# Patient Record
Sex: Male | Born: 1980 | Race: Black or African American | Hispanic: No | Marital: Single | Smoking: Never smoker
Health system: Southern US, Community
[De-identification: ages and names within clinical notes are randomized; demographics above are authoritative.]

## PROBLEM LIST (undated history)

## (undated) DIAGNOSIS — F840 Autistic disorder: Secondary | ICD-10-CM

## (undated) DIAGNOSIS — F29 Unspecified psychosis not due to a substance or known physiological condition: Secondary | ICD-10-CM

## (undated) DIAGNOSIS — F988 Other specified behavioral and emotional disorders with onset usually occurring in childhood and adolescence: Secondary | ICD-10-CM

## (undated) DIAGNOSIS — F319 Bipolar disorder, unspecified: Secondary | ICD-10-CM

## (undated) HISTORY — PX: LEG SURGERY: SHX1003

---

## 2015-05-14 ENCOUNTER — Encounter (HOSPITAL_COMMUNITY): Payer: Self-pay | Admitting: Emergency Medicine

## 2015-05-14 ENCOUNTER — Emergency Department (HOSPITAL_COMMUNITY)
Admission: EM | Admit: 2015-05-14 | Discharge: 2015-05-15 | Disposition: A | Discharge status: Home / Self Care | Payer: Medicaid - Out of State | Attending: Emergency Medicine | Admitting: Emergency Medicine

## 2015-05-14 DIAGNOSIS — W01198A Fall on same level from slipping, tripping and stumbling with subsequent striking against other object, initial encounter: Secondary | ICD-10-CM | POA: Insufficient documentation

## 2015-05-14 DIAGNOSIS — S93402A Sprain of unspecified ligament of left ankle, initial encounter: Secondary | ICD-10-CM | POA: Diagnosis not present

## 2015-05-14 DIAGNOSIS — W19XXXA Unspecified fall, initial encounter: Secondary | ICD-10-CM

## 2015-05-14 DIAGNOSIS — F319 Bipolar disorder, unspecified: Secondary | ICD-10-CM | POA: Insufficient documentation

## 2015-05-14 DIAGNOSIS — S0083XA Contusion of other part of head, initial encounter: Secondary | ICD-10-CM

## 2015-05-14 DIAGNOSIS — Y999 Unspecified external cause status: Secondary | ICD-10-CM | POA: Diagnosis not present

## 2015-05-14 DIAGNOSIS — S01511A Laceration without foreign body of lip, initial encounter: Secondary | ICD-10-CM | POA: Diagnosis not present

## 2015-05-14 DIAGNOSIS — S0990XA Unspecified injury of head, initial encounter: Secondary | ICD-10-CM | POA: Diagnosis not present

## 2015-05-14 DIAGNOSIS — S199XXA Unspecified injury of neck, initial encounter: Secondary | ICD-10-CM | POA: Insufficient documentation

## 2015-05-14 DIAGNOSIS — Y9259 Other trade areas as the place of occurrence of the external cause: Secondary | ICD-10-CM | POA: Diagnosis not present

## 2015-05-14 DIAGNOSIS — S0993XA Unspecified injury of face, initial encounter: Secondary | ICD-10-CM | POA: Diagnosis present

## 2015-05-14 DIAGNOSIS — Z23 Encounter for immunization: Secondary | ICD-10-CM | POA: Diagnosis not present

## 2015-05-14 DIAGNOSIS — Y939 Activity, unspecified: Secondary | ICD-10-CM | POA: Insufficient documentation

## 2015-05-14 DIAGNOSIS — F840 Autistic disorder: Secondary | ICD-10-CM | POA: Diagnosis not present

## 2015-05-14 HISTORY — DX: Other specified behavioral and emotional disorders with onset usually occurring in childhood and adolescence: F98.8

## 2015-05-14 HISTORY — DX: Autistic disorder: F84.0

## 2015-05-14 HISTORY — DX: Bipolar disorder, unspecified: F31.9

## 2015-05-14 HISTORY — DX: Unspecified psychosis not due to a substance or known physiological condition: F29

## 2015-05-14 MED ORDER — TETANUS-DIPHTH-ACELL PERTUSSIS 5-2.5-18.5 LF-MCG/0.5 IM SUSP: 0.5000 mL | Freq: Once | INTRAMUSCULAR | Status: DC

## 2015-05-14 MED ORDER — HYDROCODONE-ACETAMINOPHEN 5-325 MG PO TABS
1.0000 | ORAL_TABLET | Freq: Once | ORAL | Status: AC
  Administered 2015-05-14: 1 via ORAL
  Filled 2015-05-14: qty 1

## 2015-05-14 MED ORDER — TETANUS-DIPHTH-ACELL PERTUSSIS 5-2.5-18.5 LF-MCG/0.5 IM SUSP
0.5000 mL | Freq: Once | INTRAMUSCULAR | Status: AC
  Administered 2015-05-14: 0.5 mL via INTRAMUSCULAR
  Filled 2015-05-14: qty 0.5

## 2015-05-14 NOTE — ED Notes (Signed)
Pt. lost his balance and fell this evening hit his left side of face against the cabinet , no LOC , ambulatory , presents with left facial  swelling and small laceration at left upper lip .

## 2015-05-14 NOTE — ED Provider Notes (Signed)
CSN: [id number]     Arrival date & time 05/14/15  2045 History   First MD Initiated Contact with Patient 05/14/15 2113     Chief Complaint  Patient presents with  . Fall  . Facial Injury     (Consider location/radiation/quality/duration/timing/severity/associated sxs/prior Treatment) Patient is a 34 y.o. male presenting with fall. The history is provided by the patient and a caregiver.  Fall This is a new (Patient states that he tripped today in the hotel room air staying in and fell face first in the corner of a dresser) problem. The current episode started 3 to 5 hours ago. The problem occurs constantly. The problem has not changed since onset.Associated symptoms include headaches. Associated symptoms comments: Facial pain and swelling.  No LOC. Since that time patient has had a headache, neck pain and facial pain and swelling. He denies any dental pain or loose teeth.. Nothing aggravates the symptoms. Nothing relieves the symptoms. He has tried a cold compress for the symptoms. The treatment provided no relief.    Past Medical History  Diagnosis Date  . ADD (attention deficit disorder)   . Bipolar 1 disorder   . Psychosis   . Autism    Past Surgical History  Procedure Laterality Date  . Leg surgery     No family history on file. History  Substance Use Topics  . Smoking status: Never Smoker   . Smokeless tobacco: Not on file  . Alcohol Use: No    Review of Systems  Neurological: Positive for headaches.  All other systems reviewed and are negative.     Allergies  Apple and Latex  Home Medications   Prior to Admission medications   Medication Sig Start Date End Date Taking? Authorizing Provider  amLODipine (NORVASC) 10 MG tablet Take 10 mg by mouth daily. 05/07/15  Yes Historical Provider, MD  QUEtiapine (SEROQUEL) 100 MG tablet Take 100 mg by mouth daily. 05/06/15  Yes Historical Provider, MD  QUEtiapine (SEROQUEL) 300 MG tablet Take 600 mg by mouth at bedtime.  05/07/15  Yes Historical Provider, MD   BP 137/73 mmHg  Pulse 77  Temp(Src) 98.4 F (36.9 C) (Oral)  Resp 12  SpO2 99% Physical Exam  Constitutional: He is oriented to person, place, and time. He appears well-developed and well-nourished. No distress.  HENT:  Head: Normocephalic and atraumatic.    Mouth/Throat: Oropharynx is clear and moist.  Eyes: Conjunctivae and EOM are normal. Pupils are equal, round, and reactive to light.  Neck: Normal range of motion. Neck supple. Spinous process tenderness present.  Cardiovascular: Normal rate, regular rhythm and intact distal pulses.   No murmur heard. Pulmonary/Chest: Effort normal and breath sounds normal. No respiratory distress. He has no wheezes. He has no rales.  Abdominal: Soft. He exhibits no distension. There is no tenderness. There is no rebound and no guarding.  Musculoskeletal: Normal range of motion. He exhibits no edema or tenderness.  Neurological: He is alert and oriented to person, place, and time.  Skin: Skin is warm and dry. No rash noted. No erythema.  Psychiatric: He has a normal mood and affect. His behavior is normal.  Nursing note and vitals reviewed.   ED Course  Procedures (including critical care time) Labs Review Labs Reviewed - No data to display  Imaging Review No results found.   EKG Interpretation None      MDM   Final diagnoses:  Fall  Fall    Patient with a mechanical fall into the corner  of a dresser today. He is complaining of left-sided facial pain with swelling and ecchymosis. Also complaining of a headache and neck pain. Patient has a history of autism and bipolar disease but currently is stable. He has a small superficial laceration to the lip that does not require repair at this time. Tetanus shot is unknown so will update today. CT of the head, neck and face pending  Pt checked out to Dr. Wilkie AyeHorton at 2400   Gwyneth SproutWhitney Caera Enwright, MD 05/14/15 2351

## 2015-05-15 ENCOUNTER — Encounter (HOSPITAL_COMMUNITY): Payer: Self-pay | Admitting: Radiology

## 2015-05-15 ENCOUNTER — Emergency Department (HOSPITAL_COMMUNITY): Payer: Medicaid - Out of State

## 2015-05-15 MED ORDER — HYDROCODONE-ACETAMINOPHEN 5-325 MG PO TABS
1.0000 | ORAL_TABLET | Freq: Once | ORAL | Status: AC
  Administered 2015-05-15: 1 via ORAL
  Filled 2015-05-15: qty 1

## 2015-05-15 MED ORDER — HYDROCODONE-ACETAMINOPHEN 5-325 MG PO TABS: 1.0000 | ORAL_TABLET | Freq: Four times a day (QID) | ORAL | Status: DC | PRN

## 2015-05-15 NOTE — ED Notes (Signed)
Patient transported to X-ray 

## 2015-05-15 NOTE — ED Notes (Signed)
Patient transported to CT 

## 2015-05-15 NOTE — ED Notes (Signed)
Called to Ct to get updated time frame on scans; updated family pt is next on the list

## 2015-05-15 NOTE — ED Notes (Signed)
Horton MD at bedside; Pt's left leg assessed for injury; Plan of care was discussed with parents; New orders to be placed

## 2015-05-15 NOTE — ED Provider Notes (Addendum)
Patient signed out by Dr. Anitra Lauth pending imaging. I was called into the patient's room with concerns over swelling of the left leg and ankle. Caregiver states that it is swollen and the patient states that it is painful. X-rays were ordered. They all expressed concerns regarding their wait time and duration in the ER. I apologized for this.  X-rays obtained. No fractures. CT reassuring. Ace bandage and ice were ordered. Patient was ambulated. Will discharge her pain medication.  No results found for this or any previous visit. Dg Tibia/fibula Left  05/15/2015   CLINICAL DATA:  Status post fall, with left lower leg pain. Initial encounter.  EXAM: LEFT TIBIA AND FIBULA - 2 VIEW  COMPARISON:  None.  FINDINGS: There is no evidence of fracture or dislocation. The tibia and fibula appear intact. The ankle mortise is grossly unremarkable. The knee joint is incompletely assessed, but unremarkable in appearance.  Diffuse soft tissue swelling is suggested about the medial aspect of the left lower leg.  IMPRESSION: No evidence of fracture or dislocation.   Electronically Signed   By: Roanna Raider M.D.   On: 05/15/2015 02:02   Dg Ankle Complete Left  05/15/2015   CLINICAL DATA:  Fall with diffuse left leg pain.  Initial encounter.  EXAM: LEFT ANKLE COMPLETE - 3+ VIEW  COMPARISON:  None.  FINDINGS: There is no evidence of fracture, dislocation, or joint effusion.  Varicose veins in the medial lower leg.  IMPRESSION: No osseous abnormality.   Electronically Signed   By: Marnee Spring M.D.   On: 05/15/2015 02:02   Ct Head Wo Contrast  05/15/2015   CLINICAL DATA:  Fall with left-sided face of injury causing swelling and laceration. Initial encounter.  EXAM: CT HEAD WITHOUT CONTRAST  CT MAXILLOFACIAL WITHOUT CONTRAST  CT CERVICAL SPINE WITHOUT CONTRAST  TECHNIQUE: Multidetector CT imaging of the head, cervical spine, and maxillofacial structures were performed using the standard protocol without intravenous contrast.  Multiplanar CT image reconstructions of the cervical spine and maxillofacial structures were also generated.  COMPARISON:  None.  FINDINGS: CT HEAD FINDINGS  Skull and Sinuses:Facial findings discussed below. No calvarial fracture.  Brain: No infarction, hemorrhage, hydrocephalus, or mass lesion/mass effect.  CT MAXILLOFACIAL FINDINGS  There is contusion of the left cheek without underlying fracture.  No evidence of globe injury or postseptal hematoma. Calcification present along both optic nerve sheath complexes, without associated thickening/mass.  Curvilinear mineralization in the right maxillary sinus has neighboring mucosal thickening and may reflect calcified debris from chronic sinusitis.  CT CERVICAL SPINE FINDINGS  Negative for acute fracture or subluxation. No prevertebral edema. No gross cervical canal hematoma. No significant osseous canal or foraminal stenosis.  IMPRESSION: No intracranial injury or facial/cervical spine fracture.   Electronically Signed   By: Marnee Spring M.D.   On: 05/15/2015 01:34   Ct Cervical Spine Wo Contrast  05/15/2015   CLINICAL DATA:  Fall with left-sided face of injury causing swelling and laceration. Initial encounter.  EXAM: CT HEAD WITHOUT CONTRAST  CT MAXILLOFACIAL WITHOUT CONTRAST  CT CERVICAL SPINE WITHOUT CONTRAST  TECHNIQUE: Multidetector CT imaging of the head, cervical spine, and maxillofacial structures were performed using the standard protocol without intravenous contrast. Multiplanar CT image reconstructions of the cervical spine and maxillofacial structures were also generated.  COMPARISON:  None.  FINDINGS: CT HEAD FINDINGS  Skull and Sinuses:Facial findings discussed below. No calvarial fracture.  Brain: No infarction, hemorrhage, hydrocephalus, or mass lesion/mass effect.  CT MAXILLOFACIAL FINDINGS  There is  contusion of the left cheek without underlying fracture.  No evidence of globe injury or postseptal hematoma. Calcification present along both  optic nerve sheath complexes, without associated thickening/mass.  Curvilinear mineralization in the right maxillary sinus has neighboring mucosal thickening and may reflect calcified debris from chronic sinusitis.  CT CERVICAL SPINE FINDINGS  Negative for acute fracture or subluxation. No prevertebral edema. No gross cervical canal hematoma. No significant osseous canal or foraminal stenosis.  IMPRESSION: No intracranial injury or facial/cervical spine fracture.   Electronically Signed   By: Marnee SpringJonathon  Watts M.D.   On: 05/15/2015 01:34   Dg Knee Complete 4 Views Left  05/15/2015   CLINICAL DATA:  Fall with diffuse left leg pain.  Initial encounter.  EXAM: LEFT KNEE - COMPLETE 4+ VIEW  COMPARISON:  None.  FINDINGS: There is no evidence of fracture, dislocation, or joint effusion.  Sizable varicose veins medial and posterior to the knee.  IMPRESSION: 1. No osseous abnormality. 2. Large varicose veins.   Electronically Signed   By: Marnee SpringJonathon  Watts M.D.   On: 05/15/2015 02:01   Ct Maxillofacial Wo Cm  05/15/2015   CLINICAL DATA:  Fall with left-sided face of injury causing swelling and laceration. Initial encounter.  EXAM: CT HEAD WITHOUT CONTRAST  CT MAXILLOFACIAL WITHOUT CONTRAST  CT CERVICAL SPINE WITHOUT CONTRAST  TECHNIQUE: Multidetector CT imaging of the head, cervical spine, and maxillofacial structures were performed using the standard protocol without intravenous contrast. Multiplanar CT image reconstructions of the cervical spine and maxillofacial structures were also generated.  COMPARISON:  None.  FINDINGS: CT HEAD FINDINGS  Skull and Sinuses:Facial findings discussed below. No calvarial fracture.  Brain: No infarction, hemorrhage, hydrocephalus, or mass lesion/mass effect.  CT MAXILLOFACIAL FINDINGS  There is contusion of the left cheek without underlying fracture.  No evidence of globe injury or postseptal hematoma. Calcification present along both optic nerve sheath complexes, without associated  thickening/mass.  Curvilinear mineralization in the right maxillary sinus has neighboring mucosal thickening and may reflect calcified debris from chronic sinusitis.  CT CERVICAL SPINE FINDINGS  Negative for acute fracture or subluxation. No prevertebral edema. No gross cervical canal hematoma. No significant osseous canal or foraminal stenosis.  IMPRESSION: No intracranial injury or facial/cervical spine fracture.   Electronically Signed   By: Marnee SpringJonathon  Watts M.D.   On: 05/15/2015 01:34      Shon Batonourtney F Kahlyn Shippey, MD 05/15/15 [phone]  Patient's caregiver and husband were requesting FMLA paperwork to be filled out. Discussed with them that this would not be done in the ER. Given that the patient had no significant injuries and I am not his primary care provider, I do not feel it is appropriate for me to fill out additional paperwork. [facility] were [facility]  He was given a limited work excuse for 2 days.  Shon Batonourtney F Eldrige Pitkin, MD 05/15/15 [phone]

## 2015-05-15 NOTE — Discharge Instructions (Signed)
Ankle Sprain °An ankle sprain is an injury to the strong, fibrous tissues (ligaments) that hold the bones of your ankle joint together.  °CAUSES °An ankle sprain is usually caused by a fall or by twisting your ankle. Ankle sprains most commonly occur when you step on the outer edge of your foot, and your ankle turns inward. People who participate in sports are more prone to these types of injuries.  °SYMPTOMS  °· Pain in your ankle. The pain may be present at rest or only when you are trying to stand or walk. °· Swelling. °· Bruising. Bruising may develop immediately or within 1 to 2 days after your injury. °· Difficulty standing or walking, particularly when turning corners or changing directions. °DIAGNOSIS  °Your caregiver will ask you details about your injury and perform a physical exam of your ankle to determine if you have an ankle sprain. During the physical exam, your caregiver will press on and apply pressure to specific areas of your foot and ankle. Your caregiver will try to move your ankle in certain ways. An X-ray exam may be done to be sure a bone was not broken or a ligament did not separate from one of the bones in your ankle (avulsion fracture).  °TREATMENT  °Certain types of braces can help stabilize your ankle. Your caregiver can make a recommendation for this. Your caregiver may recommend the use of medicine for pain. If your sprain is severe, your caregiver may refer you to a surgeon who helps to restore function to parts of your skeletal system (orthopedist) or a physical therapist. °HOME CARE INSTRUCTIONS  °· Apply ice to your injury for 1-2 days or as directed by your caregiver. Applying ice helps to reduce inflammation and pain. °¨ Put ice in a plastic bag. °¨ Place a towel between your skin and the bag. °¨ Leave the ice on for 15-20 minutes at a time, every 2 hours while you are awake. °· Only take over-the-counter or prescription medicines for pain, discomfort, or fever as directed by  your caregiver. °· Elevate your injured ankle above the level of your heart as much as possible for 2-3 days. °· If your caregiver recommends crutches, use them as instructed. Gradually put weight on the affected ankle. Continue to use crutches or a cane until you can walk without feeling pain in your ankle. °· If you have a plaster splint, wear the splint as directed by your caregiver. Do not rest it on anything harder than a pillow for the first 24 hours. Do not put weight on it. Do not get it wet. You may take it off to take a shower or bath. °· You may have been given an elastic bandage to wear around your ankle to provide support. If the elastic bandage is too tight (you have numbness or tingling in your foot or your foot becomes cold and blue), adjust the bandage to make it comfortable. °· If you have an air splint, you may blow more air into it or let air out to make it more comfortable. You may take your splint off at night and before taking a shower or bath. Wiggle your toes in the splint several times per day to decrease swelling. °SEEK MEDICAL CARE IF:  °· You have rapidly increasing bruising or swelling. °· Your toes feel extremely cold or you lose feeling in your foot. °· Your pain is not relieved with medicine. °SEEK IMMEDIATE MEDICAL CARE IF: °· Your toes are numb or blue. °·   You have severe pain that is increasing. MAKE SURE YOU:   Understand these instructions.  Will watch your condition.  Will get help right away if you are not doing well or get worse. Document Released: 10/27/2005 Document Revised: 07/21/2012 Document Reviewed: 11/08/2011 [facility] Patient Information 2015 [employer], [place]. This information is not intended to replace advice given to you by your health care provider. Make sure you discuss any questions you have with your health care provider. Facial or Scalp Contusion A facial or scalp contusion is a deep bruise on the face or head. Injuries to the face and head generally  cause a lot of swelling, especially around the eyes. Contusions are the result of an injury that caused bleeding under the skin. The contusion may turn blue, purple, or yellow. Minor injuries will give you a painless contusion, but more severe contusions may stay painful and swollen for a few weeks.  CAUSES  A facial or scalp contusion is caused by a blunt injury or trauma to the face or head area.  SIGNS AND SYMPTOMS   Swelling of the injured area.   Discoloration of the injured area.   Tenderness, soreness, or pain in the injured area.  DIAGNOSIS  The diagnosis can be made by taking a medical history and doing a physical exam. An X-ray exam, CT scan, or MRI may be needed to determine if there are any associated injuries, such as broken bones (fractures). TREATMENT  Often, the best treatment for a facial or scalp contusion is applying cold compresses to the injured area. Over-the-counter medicines may also be recommended for pain control.  HOME CARE INSTRUCTIONS   Only take over-the-counter or prescription medicines as directed by your health care provider.   Apply ice to the injured area.   Put ice in a plastic bag.   Place a towel between your skin and the bag.   Leave the ice on for 20 minutes, 2-3 times a day.  SEEK MEDICAL CARE IF:  You have bite problems.   You have pain with chewing.   You are concerned about facial defects. SEEK IMMEDIATE MEDICAL CARE IF:  You have severe pain or a headache that is not relieved by medicine.   You have unusual sleepiness, confusion, or personality changes.   You throw up (vomit).   You have a persistent nosebleed.   You have double vision or blurred vision.   You have fluid drainage from your nose or ear.   You have difficulty walking or using your arms or legs.  MAKE SURE YOU:   Understand these instructions.  Will watch your condition.  Will get help right away if you are not doing well or get  worse. Document Released: 12/04/2004 Document Revised: 08/17/2013 Document Reviewed: 06/09/2013 [facility] Patient Information 2015 [place], [place]. This information is not intended to replace advice given to you by your health care provider. Make sure you discuss any questions you have with your health care provider.

## 2015-05-19 ENCOUNTER — Emergency Department (HOSPITAL_COMMUNITY)
Admission: EM | Admit: 2015-05-19 | Discharge: 2015-05-22 | Disposition: A | Discharge status: Other Acute Inpatient Hospital | Payer: Medicaid - Out of State | Attending: Emergency Medicine | Admitting: Emergency Medicine

## 2015-05-19 ENCOUNTER — Encounter (HOSPITAL_COMMUNITY): Payer: Self-pay | Admitting: *Deleted

## 2015-05-19 DIAGNOSIS — Z91040 Latex allergy status: Secondary | ICD-10-CM | POA: Diagnosis not present

## 2015-05-19 DIAGNOSIS — Y9389 Activity, other specified: Secondary | ICD-10-CM | POA: Diagnosis not present

## 2015-05-19 DIAGNOSIS — Y9259 Other trade areas as the place of occurrence of the external cause: Secondary | ICD-10-CM | POA: Insufficient documentation

## 2015-05-19 DIAGNOSIS — R451 Restlessness and agitation: Secondary | ICD-10-CM | POA: Insufficient documentation

## 2015-05-19 DIAGNOSIS — Y998 Other external cause status: Secondary | ICD-10-CM | POA: Insufficient documentation

## 2015-05-19 DIAGNOSIS — F909 Attention-deficit hyperactivity disorder, unspecified type: Secondary | ICD-10-CM | POA: Diagnosis present

## 2015-05-19 DIAGNOSIS — I10 Essential (primary) hypertension: Secondary | ICD-10-CM | POA: Diagnosis not present

## 2015-05-19 DIAGNOSIS — F319 Bipolar disorder, unspecified: Secondary | ICD-10-CM

## 2015-05-19 DIAGNOSIS — S0083XA Contusion of other part of head, initial encounter: Secondary | ICD-10-CM | POA: Diagnosis not present

## 2015-05-19 DIAGNOSIS — W1839XA Other fall on same level, initial encounter: Secondary | ICD-10-CM | POA: Diagnosis not present

## 2015-05-19 DIAGNOSIS — R45850 Homicidal ideations: Secondary | ICD-10-CM

## 2015-05-19 DIAGNOSIS — H1132 Conjunctival hemorrhage, left eye: Secondary | ICD-10-CM | POA: Insufficient documentation

## 2015-05-19 LAB — ETHANOL

## 2015-05-19 LAB — COMPREHENSIVE METABOLIC PANEL
ALK PHOS: 85 U/L (ref 38–126)
ALT: 32 U/L (ref 17–63)
AST: 26 U/L (ref 15–41)
Albumin: 4.2 g/dL (ref 3.5–5.0)
Anion gap: 8 (ref 5–15)
BUN: 15 mg/dL (ref 6–20)
CALCIUM: 9.3 mg/dL (ref 8.9–10.3)
CO2: 27 mmol/L (ref 22–32)
Chloride: 100 mmol/L — ABNORMAL LOW (ref 101–111)
Creatinine, Ser: 0.62 mg/dL (ref 0.61–1.24)
GLUCOSE: 128 mg/dL — AB (ref 65–99)
POTASSIUM: 4.1 mmol/L (ref 3.5–5.1)
SODIUM: 135 mmol/L (ref 135–145)
Total Bilirubin: 0.2 mg/dL — ABNORMAL LOW (ref 0.3–1.2)
Total Protein: 7.4 g/dL (ref 6.5–8.1)

## 2015-05-19 LAB — ACETAMINOPHEN LEVEL: Acetaminophen (Tylenol), Serum: <10 ug/mL — ABNORMAL LOW (ref 10–30)

## 2015-05-19 LAB — RAPID URINE DRUG SCREEN, HOSP PERFORMED
AMPHETAMINES: NOT DETECTED
BARBITURATES: NOT DETECTED
BENZODIAZEPINES: NOT DETECTED
COCAINE: NOT DETECTED
Opiates: POSITIVE — AB
TETRAHYDROCANNABINOL: NOT DETECTED

## 2015-05-19 LAB — CBC
HCT: 37.1 % — ABNORMAL LOW (ref 39.0–52.0)
Hemoglobin: 12.3 g/dL — ABNORMAL LOW (ref 13.0–17.0)
MCH: 28.5 pg (ref 26.0–34.0)
MCHC: 33.2 g/dL (ref 30.0–36.0)
MCV: 85.9 fL (ref 78.0–100.0)
PLATELETS: 241 10*3/uL (ref 150–400)
RBC: 4.32 MIL/uL (ref 4.22–5.81)
RDW: 13.8 % (ref 11.5–15.5)
WBC: 6.7 10*3/uL (ref 4.0–10.5)

## 2015-05-19 LAB — SALICYLATE LEVEL

## 2015-05-19 MED ORDER — HYDROCODONE-ACETAMINOPHEN 5-325 MG PO TABS
1.0000 | ORAL_TABLET | Freq: Four times a day (QID) | ORAL | Status: DC | PRN
  Administered 2015-05-20 – 2015-05-22 (×7): 1 via ORAL
  Filled 2015-05-19 (×7): qty 1

## 2015-05-19 MED ORDER — QUETIAPINE FUMARATE 300 MG PO TABS
600.0000 mg | ORAL_TABLET | Freq: Every day | ORAL | Status: DC
  Administered 2015-05-19: 600 mg via ORAL
  Filled 2015-05-19: qty 2

## 2015-05-19 MED ORDER — QUETIAPINE FUMARATE 100 MG PO TABS: 100.0000 mg | ORAL_TABLET | Freq: Every day | ORAL | Status: DC

## 2015-05-19 MED ORDER — ALUM & MAG HYDROXIDE-SIMETH 200-200-20 MG/5ML PO SUSP: 30.0000 mL | ORAL | Status: DC | PRN

## 2015-05-19 MED ORDER — QUETIAPINE FUMARATE 300 MG PO TABS
600.0000 mg | ORAL_TABLET | Freq: Every day | ORAL | Status: DC
  Administered 2015-05-20 – 2015-05-21 (×2): 600 mg via ORAL
  Filled 2015-05-19 (×2): qty 2

## 2015-05-19 MED ORDER — NICOTINE 21 MG/24HR TD PT24
21.0000 mg | MEDICATED_PATCH | Freq: Every day | TRANSDERMAL | Status: DC
  Filled 2015-05-19: qty 1

## 2015-05-19 MED ORDER — ONDANSETRON HCL 4 MG PO TABS: 4.0000 mg | ORAL_TABLET | Freq: Three times a day (TID) | ORAL | Status: DC | PRN

## 2015-05-19 MED ORDER — IBUPROFEN 200 MG PO TABS
600.0000 mg | ORAL_TABLET | Freq: Three times a day (TID) | ORAL | Status: DC | PRN
  Administered 2015-05-20 – 2015-05-21 (×4): 600 mg via ORAL
  Filled 2015-05-19 (×4): qty 3

## 2015-05-19 MED ORDER — AMLODIPINE BESYLATE 10 MG PO TABS
10.0000 mg | ORAL_TABLET | Freq: Every day | ORAL | Status: DC
Start: 2015-05-19 — End: 2015-05-22
  Administered 2015-05-20 – 2015-05-22 (×3): 10 mg via ORAL
  Filled 2015-05-19 (×4): qty 1

## 2015-05-19 MED ORDER — LORAZEPAM 1 MG PO TABS
1.0000 mg | ORAL_TABLET | Freq: Three times a day (TID) | ORAL | Status: DC | PRN
  Administered 2015-05-20 – 2015-05-22 (×3): 1 mg via ORAL
  Filled 2015-05-19 (×3): qty 1

## 2015-05-19 MED ORDER — ZOLPIDEM TARTRATE 5 MG PO TABS
5.0000 mg | ORAL_TABLET | Freq: Every evening | ORAL | Status: DC | PRN
  Administered 2015-05-21: 5 mg via ORAL
  Filled 2015-05-19: qty 1

## 2015-05-19 MED ORDER — ACETAMINOPHEN 325 MG PO TABS
650.0000 mg | ORAL_TABLET | ORAL | Status: DC | PRN
  Filled 2015-05-19: qty 2

## 2015-05-19 MED ORDER — OXCARBAZEPINE 300 MG PO TABS: 600.0000 mg | ORAL_TABLET | Freq: Once | ORAL | Status: DC

## 2015-05-19 MED ORDER — ACETAMINOPHEN 325 MG PO TABS
650.0000 mg | ORAL_TABLET | ORAL | Status: DC | PRN
  Administered 2015-05-19: 650 mg via ORAL
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
## 2015-05-19 NOTE — [facility] Counselor (Signed)
Per Willie SamFran Hobson, NP, pt meets inpt tx criteria. [facility]. Pt will be referred out to other facilities.  Counselor informed pt's attending RN of disposition. RN informed pt's parents, per their request.   [name], [facility] Triage Specialist
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
## 2015-05-19 NOTE — [facility] Counselor (Signed)
TTS Counselor spoke with Merry ProudBrandi, RN about need for assessment. She went to place machine in pt's room.   Counselor also reviewed pt's chart in preparation for tele-assessment and called Dr Gwendolyn GrantWalden to speak about consult but he was in room with another pt at the time.   Assessment to begin shortly.   Cyndie MullAnna Saylor Murry, [facility] Triage Specialist
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
## 2015-05-19 NOTE — ED Notes (Signed)
Bed: WA27 Expected date:  Expected time:  Means of arrival:  Comments: 

## 2015-05-20 DIAGNOSIS — R45850 Homicidal ideations: Secondary | ICD-10-CM

## 2015-05-20 DIAGNOSIS — R451 Restlessness and agitation: Secondary | ICD-10-CM

## 2015-05-20 MED ORDER — QUETIAPINE FUMARATE 100 MG PO TABS
100.0000 mg | ORAL_TABLET | Freq: Every day | ORAL | Status: DC
  Administered 2015-05-20 – 2015-05-22 (×3): 100 mg via ORAL
  Filled 2015-05-20 (×3): qty 1

## 2015-05-20 MED ORDER — OXCARBAZEPINE 300 MG PO TABS
600.0000 mg | ORAL_TABLET | Freq: Every day | ORAL | Status: DC
  Filled 2015-05-20: qty 2

## 2015-05-20 NOTE — BH Assessment (Signed)
[facility]   Clinician sent out referrals for patient to [facility] and [facility].

## 2015-05-20 NOTE — ED Notes (Signed)
Pt becoming anxious and states he wants to go home. Explained to patient that he is waiting on consult this morning, meal given, pt given po Ativan.
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
## 2015-05-21 ENCOUNTER — Emergency Department (HOSPITAL_BASED_OUTPATIENT_CLINIC_OR_DEPARTMENT_OTHER)
Admit: 2015-05-21 | Discharge: 2015-05-21 | Disposition: A | Discharge status: Home / Self Care | Payer: Medicaid - Out of State | Attending: Emergency Medicine | Admitting: Emergency Medicine

## 2015-05-21 DIAGNOSIS — F319 Bipolar disorder, unspecified: Secondary | ICD-10-CM

## 2015-05-21 DIAGNOSIS — F902 Attention-deficit hyperactivity disorder, combined type: Secondary | ICD-10-CM

## 2015-05-21 DIAGNOSIS — M7989 Other specified soft tissue disorders: Secondary | ICD-10-CM | POA: Diagnosis not present

## 2015-05-21 DIAGNOSIS — M79609 Pain in unspecified limb: Secondary | ICD-10-CM | POA: Diagnosis not present

## 2015-05-21 DIAGNOSIS — F909 Attention-deficit hyperactivity disorder, unspecified type: Secondary | ICD-10-CM | POA: Diagnosis present

## 2015-05-21 NOTE — ED Provider Notes (Signed)
Pt with unilateral leg swelling in the LLE. Intact pulse. He is ambulating. There are neg films from 7/4, tib fib and ankle on the same side. Will get US DVT and ambulate again.  Derwood KaplanAnkit Anan Dapolito, MD 05/21/15 [phone]

## 2015-05-21 NOTE — BHH Counselor (Signed)
[facility], and [facility] in effort to obtain inpt tx for pt.   Cyndie MullAnna Tonesha Tsou, [facility] Therapeutic Triage
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
## 2015-05-21 NOTE — ED Provider Notes (Signed)
Signed out by Dr Rhunette CroftNanavati to check vascular u/s results.  U/s neg - see below:  tient Information    Patient Name Sex DOB SSN   Willie Boyer, Willie Boyer Male Jul 03, 1981 [id number]    Progress Notes by Kerrin ChampagneVirginia D Slaughter at 05/21/2015 9:03 AM    Author: Kerrin ChampagneVirginia D Slaughter Service: Vascular Lab Author Type: Cardiovascular Sonographer   Filed: 05/21/2015 9:04 AM Note Time: 05/21/2015 9:03 AM Status: Signed   Editor: Nolberto HanlonVirginia D Slaughter (Cardiovascular Sonographer)     Expand All Collapse All   VASCULAR LAB PRELIMINARY PRELIMINARY PRELIMINARY PRELIMINARY  Left lower extremity venous duplex completed.   Preliminary report: Left: No evidence of DVT, superficial thrombosis, or Baker's cyst.  SLAUGHTER, VIRGINIA, RVS 05/21/2015, 9:04 AM      Of note, family indicates pt with congenitally larger left side of body, and that at baseline his left arm and left are much larger than right, c/w current exam.  Pt does have mild swelling and tenderness to the left ankle, and has recent twisting injury to ankle/fall. xrays negative.  ASO brace for comfort/support. Distal pulses palp. Skin intact. No sign of infection.   Patient is medically clear for psychiatric evaluation/disposition.    Cathren LaineKevin Esias Mory, MD 05/21/15 1158

## 2015-05-21 NOTE — Progress Notes (Signed)
VASCULAR LAB PRELIMINARY  PRELIMINARY  PRELIMINARY  PRELIMINARY  Left lower extremity venous duplex completed.    Preliminary report:  Left:  No evidence of DVT, superficial thrombosis, or Baker's cyst.  Shiva Karis, RVS 05/21/2015, 9:04 AM

## 2015-05-21 NOTE — Progress Notes (Signed)
CSW consulted with EDP regarding concerns of swelling in leg. Dr. Denton LankSteinl to evaluate.   Olga CoasterKristen Cayce Quezada, LCSW  Clinical Social Work  [facility] [phone]

## 2015-05-21 NOTE — ED Notes (Signed)
Pt  Complained of pain in left ankle.  Xray was done 7/5 after fall.  No fx shown on xray.  Entire left leg is swollen twice the size of the right.  Will ask MD to assess when he rounds. Barnett HatterKallam, Tonea Leiphart P

## 2015-05-21 NOTE — ED Notes (Signed)
Pt is awake and alert, denies SI/HI.  pt ambulating w/o assistance. Pt has complaints of pain to LLE. Pt reports that  pain is decreased with medication. Safety monitored and maintained. tlewis

## 2015-05-22 DIAGNOSIS — F319 Bipolar disorder, unspecified: Secondary | ICD-10-CM

## 2015-05-22 MED ORDER — HYDROCODONE-ACETAMINOPHEN 5-325 MG PO TABS: 1.0000 | ORAL_TABLET | Freq: Four times a day (QID) | ORAL | Status: DC | PRN

## 2015-05-22 MED ORDER — OXCARBAZEPINE 300 MG PO TABS: 300.0000 mg | ORAL_TABLET | Freq: Two times a day (BID) | ORAL | Status: DC

## 2015-05-22 MED ORDER — QUETIAPINE FUMARATE 100 MG PO TABS: 100.0000 mg | ORAL_TABLET | Freq: Every day | ORAL | Status: DC

## 2015-05-22 MED ORDER — NICOTINE 21 MG/24HR TD PT24: 21.0000 mg | MEDICATED_PATCH | Freq: Every day | TRANSDERMAL | Status: DC

## 2015-05-22 MED ORDER — AMLODIPINE BESYLATE 10 MG PO TABS: 10.0000 mg | ORAL_TABLET | Freq: Every day | ORAL | Status: AC

## 2015-05-22 MED ORDER — QUETIAPINE FUMARATE 300 MG PO TABS: 600.0000 mg | ORAL_TABLET | Freq: Every day | ORAL | Status: DC
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
## 2015-05-22 NOTE — B[facility] Counselor (Addendum)
[facility] Assessment Progress Note  [facility] per Deanna in intake, due to insurance.   [facility], per BaggsJoan in intake, due to residency and behavioral acuity.  Johny ShockSamantha M. Ladona Ridgelaylor, MS, N[facility] Disposition Counselor
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
## 2015-05-22 NOTE — [facility] Suicide Risk Assessment (Cosign Needed)
Suicide Risk Assessment  Discharge Assessment   [facility]   Demographic Factors:  Male, Low socioeconomic status and Unemployed  Total Time spent with patient: 20 minutes  Musculoskeletal: Strength & Muscle Tone: within normal limits Gait & Station: normal Patient leans: N/A  Psychiatric Specialty Exam:     Blood pressure 130/75, pulse 81, temperature 98.3 F (36.8 C), temperature source Oral, resp. rate 18, SpO2 97 %.There is no height or weight on file to calculate BMI.  General Appearance: Casual and Fairly Groomed  Patent attorneyye Contact::  Minimal  Speech:  Garbled  Volume:  Normal  Mood:  Anxious  Affect:  Congruent  Thought Process:  Coherent  Orientation:  Full (Time, Place, and Person)  Thought Content:  WDL and Rumination  Suicidal Thoughts:  No  Homicidal Thoughts:  No  Memory:  Immediate;   Fair Recent;   Fair Remote;   Fair  Judgement:  Impaired  Insight:  Lacking  Psychomotor Activity:  Normal  Concentration:  Fair  Recall:  [employer]Fair  Fund of Knowledge:Fair  Language: Fair  Akathisia:  No  Handed:  Right  AIMS (if indicated):     Assets:  Physical Health Resilience Social Support  ADL's:  Intact  Cognition: Impaired,  Mild        Has this patient used any form of tobacco in the last 30 days? (Cigarettes, Smokeless Tobacco, Cigars, and/or Pipes) Yes, Prescription not provided because: Prescription for Nicotine patch is given  Mental Status Per Nursing Assessment::   On Admission:     Current Mental Status by Physician: NA  Loss Factors: NA  Historical Factors: NA  Risk Reduction Factors:   Living with another person, especially a relative  Continued Clinical Symptoms:  Severe Anxiety and/or Agitation Bipolar Disorder:   Depressive phase More than one psychiatric diagnosis  Cognitive Features That Contribute To Risk:  Polarized thinking    Suicide Risk:  Minimal: No identifiable suicidal ideation.  Patients  presenting with no risk factors but with morbid ruminations; may be classified as minimal risk based on the severity of the depressive symptoms  Principal Problem: Bipolar 1 disorder Discharge Diagnoses:  Patient Active Problem List   Diagnosis Date Noted  . ADHD (attention deficit hyperactivity disorder) [F90.9] 05/21/2015  . Bipolar 1 disorder [F31.9] 05/21/2015      Plan Of Care/Follow-up recommendations:  Activity:  as tolerated Diet:  regular  Is patient on multiple antipsychotic therapies at discharge:  No   Has Patient had three or more failed trials of antipsychotic monotherapy by history:  No  Recommended Plan for Multiple Antipsychotic Therapies: NA    Domnick Chervenak C    PMHNP-BC 05/22/2015, 12:15 PM

## 2015-05-23 NOTE — Progress Notes (Signed)
CSW spoke with pt mother by phone to arrange discharge plans. Pt mother stated she was waiting on appliances to be delievered to their new house and they were on the way at that moment. CSW and pt mother discussed meeting at 2pm to go over discharge instructions and medications. During the conversation, pt mother stated that she was told yesterday that patient would remain in the hosptial until after 7/14. 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 Patient mother then asked why patient was not able to stay at the behavioral health hosptial across the street. CSW and pt mother discusseed that patient is psychiatrically stable for discharge per psychiatrist and NP and that patient was discharged [facility] and with medications received in the hospital. Patient mother stated, well I even got a letter to give the airport that he was still in the hospital. CSW and pt mother discussed that while patients are in the emergency department for crisis, patient are continuouslly assessed and monitored and once stable and no longer in need of crisis intervention/treatment inpatient patients are discharged to follow up with outpatient care. Pt mother stated she would meet iwht csw at 2pm. Pt mother requested patient to have his medications.    Addendum: 2:10pm  CSW called nurse to inquire if patient mother had arrived as CSW had not been notified yet. Patient RN stated patient mother had arrived and picked up patient and they had already left. Patient mother stopped in lobby requesting patient blood pressure medicine. RN and NP discussed, NP wrote remaining prescriptions. RN to inform patient family that patient will need to have medications managed at outpatient primary care doctor and outpatient mental health for future refills.   Olga CoasterKristen Tanairi Cypert, LCSW   Clinical Social Work  [facility] [phone]

## 2015-07-24 ENCOUNTER — Other Ambulatory Visit (HOSPITAL_COMMUNITY): Payer: Self-pay | Admitting: Psychiatry

## 2015-09-21 ENCOUNTER — Emergency Department (HOSPITAL_COMMUNITY): Payer: Medicaid Other

## 2015-09-21 ENCOUNTER — Encounter (HOSPITAL_COMMUNITY): Payer: Self-pay | Admitting: *Deleted

## 2015-09-21 ENCOUNTER — Emergency Department (HOSPITAL_COMMUNITY)
Admission: EM | Admit: 2015-09-21 | Discharge: 2015-09-21 | Disposition: A | Discharge status: Home / Self Care | Payer: Medicaid Other | Attending: Emergency Medicine | Admitting: Emergency Medicine

## 2015-09-21 DIAGNOSIS — Z79899 Other long term (current) drug therapy: Secondary | ICD-10-CM | POA: Diagnosis not present

## 2015-09-21 DIAGNOSIS — R1111 Vomiting without nausea: Secondary | ICD-10-CM | POA: Insufficient documentation

## 2015-09-21 DIAGNOSIS — F319 Bipolar disorder, unspecified: Secondary | ICD-10-CM | POA: Diagnosis not present

## 2015-09-21 DIAGNOSIS — Z7951 Long term (current) use of inhaled steroids: Secondary | ICD-10-CM | POA: Diagnosis not present

## 2015-09-21 DIAGNOSIS — R040 Epistaxis: Secondary | ICD-10-CM | POA: Insufficient documentation

## 2015-09-21 DIAGNOSIS — F909 Attention-deficit hyperactivity disorder, unspecified type: Secondary | ICD-10-CM | POA: Diagnosis not present

## 2015-09-21 DIAGNOSIS — Z91040 Latex allergy status: Secondary | ICD-10-CM | POA: Insufficient documentation

## 2015-09-21 DIAGNOSIS — R1110 Vomiting, unspecified: Secondary | ICD-10-CM | POA: Diagnosis present

## 2015-09-21 DIAGNOSIS — F29 Unspecified psychosis not due to a substance or known physiological condition: Secondary | ICD-10-CM | POA: Diagnosis not present

## 2015-09-21 DIAGNOSIS — F840 Autistic disorder: Secondary | ICD-10-CM | POA: Insufficient documentation

## 2015-09-21 LAB — CBC WITH DIFFERENTIAL/PLATELET
Basophils Absolute: 0 10*3/uL (ref 0.0–0.1)
Basophils Relative: 0 %
Eosinophils Absolute: 0.3 10*3/uL (ref 0.0–0.7)
Eosinophils Relative: 4 %
HEMATOCRIT: 41.1 % (ref 39.0–52.0)
Hemoglobin: 14.1 g/dL (ref 13.0–17.0)
LYMPHS ABS: 1.8 10*3/uL (ref 0.7–4.0)
LYMPHS PCT: 24 %
MCH: 28.8 pg (ref 26.0–34.0)
MCHC: 34.3 g/dL (ref 30.0–36.0)
MCV: 83.9 fL (ref 78.0–100.0)
MONO ABS: 0.9 10*3/uL (ref 0.1–1.0)
MONOS PCT: 12 %
NEUTROS ABS: 4.3 10*3/uL (ref 1.7–7.7)
Neutrophils Relative %: 60 %
Platelets: 216 10*3/uL (ref 150–400)
RBC: 4.9 MIL/uL (ref 4.22–5.81)
RDW: 13.2 % (ref 11.5–15.5)
WBC: 7.3 10*3/uL (ref 4.0–10.5)

## 2015-09-21 LAB — BASIC METABOLIC PANEL
ANION GAP: 7 (ref 5–15)
BUN: 14 mg/dL (ref 6–20)
CALCIUM: 9.3 mg/dL (ref 8.9–10.3)
CO2: 26 mmol/L (ref 22–32)
Chloride: 101 mmol/L (ref 101–111)
Creatinine, Ser: 0.7 mg/dL (ref 0.61–1.24)
GFR calc Af Amer: >60 mL/min (ref >60)
GFR calc non Af Amer: >60 mL/min (ref >60)
GLUCOSE: 89 mg/dL (ref 65–99)
Potassium: 4.1 mmol/L (ref 3.5–5.1)
Sodium: 134 mmol/L — ABNORMAL LOW (ref 135–145)

## 2015-09-21 LAB — URINALYSIS, ROUTINE W REFLEX MICROSCOPIC
Bilirubin Urine: NEGATIVE
Glucose, UA: NEGATIVE mg/dL
Hgb urine dipstick: NEGATIVE
KETONES UR: NEGATIVE mg/dL
LEUKOCYTES UA: NEGATIVE
NITRITE: NEGATIVE
PH: 7.5 (ref 5.0–8.0)
Protein, ur: NEGATIVE mg/dL
SPECIFIC GRAVITY, URINE: 1.01 (ref 1.005–1.030)
Urobilinogen, UA: 1 mg/dL (ref 0.0–1.0)

## 2015-09-21 NOTE — ED Notes (Signed)
Pt and family left without notifying staff; before leaving mother had conceern about "blockage" in pt's throat; wanted us to feed him so we could see him throw up.  Explained to pt that we would not give patient food if it is suspected that he has a blockage, stricture or other issue that would impede him from swallowing because it was an aspiration risk.  Physician was approached about seeing pt and family and stated that he would.  Pt seemed stable and compliant at last check but parents were agitated at the time taken for patient to be seen.  No signs of aggression/agitation nor signs of nausea/vomiting.

## 2015-09-21 NOTE — ED Notes (Signed)
Pt reports vomiting x 2 weeks, as well as nosebleeds, denies diarrhea, fever, mom sts pt gets very angry with because of his symptoms and wants to fight.

## 2015-09-21 NOTE — Progress Notes (Signed)
pcp is [facility] [address] [place], [place] [phone]

## 2015-09-21 NOTE — ED Notes (Signed)
Mom sts pt is not able to keep any food down, she is giving pedialyte for hydration.

## 2015-09-21 NOTE — Progress Notes (Signed)
  Follow-up With Details Why Contact Info  pcp is [facility] [address] [place], [place] [phone] Schedule an appointment as soon as possible for a visit As needed-This is your assigned medicaid [place] access doctor If you prefer another contact DSS [phone] pcp is [facility] [address] [place], [place] [phone]   medicaid patient  Medicaid Transportation assists you to your dr appointments: [phone] or [phone] Transportation Supervisor [phone]  As a Medicaid client you MUST contact them each time you change address, move to another county or another state to keep your address updated [place]: [phone] (main) [email or web address] [address] [place], [place] [phone]

## 2015-09-21 NOTE — ED Notes (Signed)
Bed: [id number] Expected date:  Expected time:  Means of arrival:  Comments: [place]

## 2015-09-24 NOTE — ED Provider Notes (Signed)
CSN: [id number]     Arrival date & time 09/21/15  1401 History   First MD Initiated Contact with Patient 09/21/15 1512     Chief Complaint  Patient presents with  . Emesis  . Epistaxis     (Consider location/radiation/quality/duration/timing/severity/associated sxs/prior Treatment) HPI Comments: Pt comes in with cc of poor po intake. Per father, who provides majority of hx., his son is unable to keep food down. For the past several days when pt eats, he will throw up. Pt has no diarrhea. No abd pain. They are not sure if he has difficulty swallowing or there is a blockage. He is able to keep water down. The emesis occurs immediately after po intake. + new meds - oxcarbazepine.   The history is provided by the patient and a parent.    Past Medical History  Diagnosis Date  . ADD (attention deficit disorder)   . Bipolar 1 disorder (HCC)   . Psychosis   . Autism    Past Surgical History  Procedure Laterality Date  . Leg surgery     No family history on file. Social History  Substance Use Topics  . Smoking status: Never Smoker   . Smokeless tobacco: None  . Alcohol Use: No    Review of Systems  Constitutional: Negative for activity change and appetite change.  Respiratory: Negative for cough and shortness of breath.   Cardiovascular: Negative for chest pain.  Gastrointestinal: Positive for vomiting. Negative for abdominal pain.  Genitourinary: Negative for dysuria.      Allergies  Apple and Latex  Home Medications   Prior to Admission medications   Medication Sig Start Date End Date Taking? Authorizing Provider  amLODipine (NORVASC) 10 MG tablet Take 1 tablet (10 mg total) by mouth daily. 05/22/15  Yes Earney NavyJosephine C Onuoha, NP  cloNIDine (CATAPRES) 0.1 MG tablet Take 0.1 mg by mouth at bedtime.   Yes Historical Provider, MD  divalproex (DEPAKOTE ER) 500 MG 24 hr tablet Take 500 mg by mouth at bedtime.   Yes Historical Provider, MD  fluticasone (FLONASE) 50 MCG/ACT  nasal spray Place 1 spray into both nostrils daily.   Yes Historical Provider, MD  OLANZapine (ZYPREXA) 5 MG tablet Take 5 mg by mouth at bedtime.   Yes Historical Provider, MD  Oxcarbazepine (TRILEPTAL) 300 MG tablet Take 1 tablet (300 mg total) by mouth 2 (two) times daily. 05/22/15  Yes Earney NavyJosephine C Onuoha, NP  oxcarbazepine (TRILEPTAL) 600 MG tablet Take 1,200 mg by mouth 2 (two) times daily.   Yes Historical Provider, MD  propranolol (INDERAL) 20 MG tablet Take 20 mg by mouth at bedtime.   Yes Historical Provider, MD  HYDROcodone-acetaminophen (NORCO/VICODIN) 5-325 MG per tablet Take 1 tablet by mouth every 6 (six) hours as needed for moderate pain. Patient not taking: Reported on 09/21/2015 05/22/15   Earney NavyJosephine C Onuoha, NP  nicotine (NICODERM CQ - DOSED IN MG/24 HOURS) 21 mg/24hr patch Place 1 patch (21 mg total) onto the skin daily. Patient not taking: Reported on 09/21/2015 05/22/15   Earney NavyJosephine C Onuoha, NP  QUEtiapine (SEROQUEL) 100 MG tablet Take 1 tablet (100 mg total) by mouth daily. Patient not taking: Reported on 09/21/2015 05/22/15   Earney NavyJosephine C Onuoha, NP  QUEtiapine (SEROQUEL) 300 MG tablet Take 2 tablets (600 mg total) by mouth at bedtime. Patient not taking: Reported on 09/21/2015 05/22/15   Earney NavyJosephine C Onuoha, NP   BP 142/73 mmHg  Pulse 87  Temp(Src) 98 F (36.7 C) (Oral)  Resp  18  SpO2 99% Physical Exam  Constitutional: He is oriented to person, place, and time. He appears well-developed.  HENT:  Head: Atraumatic.  Neck: Neck supple. No thyromegaly present.  Cardiovascular: Normal rate.   Pulmonary/Chest: Effort normal. No stridor.  Abdominal: Soft. He exhibits no distension. There is no tenderness.  Lymphadenopathy:    He has no cervical adenopathy.  Neurological: He is alert and oriented to person, place, and time.  Skin: Skin is warm.  Nursing note and vitals reviewed.   ED Course  Procedures (including critical care time) Labs Review Labs Reviewed  BASIC  METABOLIC PANEL - Abnormal; Notable for the following:    Sodium 134 (*)    All other components within normal limits  CBC WITH DIFFERENTIAL/PLATELET  URINALYSIS, ROUTINE W REFLEX MICROSCOPIC (NOT AT [facility])    Imaging Review No results found. I have personally reviewed and evaluated these images and lab results as part of my medical decision-making.   EKG Interpretation None      MDM   Final diagnoses:  Non-intractable vomiting without nausea, vomiting of unspecified type    Pt with emesis. He is not dehydrated. He has no signs of throat mass, and the swallowing mechanism appears to be normal. Labs are reassuring. Could me meds related - carbamazepine can cause emesis, but i dont think it's a good idea to stop the meds abruptly.     Derwood Kaplan, MD 09/25/15 [phone]

## 2015-09-24 NOTE — ED Provider Notes (Signed)
Discussed the case with mother, Informed her to let the prescribing psychiatrist that both trileptal and depakote can cause GI symptoms at high degree - and to have them look into them as a possible causative agents. She appreciates the f/u and informs that the appt is coming up this week.  Derwood KaplanAnkit Zaia Carre, MD 09/24/15 [phone]

## 2015-10-07 ENCOUNTER — Emergency Department (HOSPITAL_COMMUNITY)
Admission: EM | Admit: 2015-10-07 | Discharge: 2015-10-09 | Disposition: A | Discharge status: Home / Self Care | Payer: Medicaid Other | Attending: Emergency Medicine | Admitting: Emergency Medicine

## 2015-10-07 ENCOUNTER — Encounter (HOSPITAL_COMMUNITY): Payer: Self-pay | Admitting: Emergency Medicine

## 2015-10-07 DIAGNOSIS — Z91040 Latex allergy status: Secondary | ICD-10-CM | POA: Insufficient documentation

## 2015-10-07 DIAGNOSIS — F840 Autistic disorder: Secondary | ICD-10-CM | POA: Insufficient documentation

## 2015-10-07 DIAGNOSIS — Z79899 Other long term (current) drug therapy: Secondary | ICD-10-CM | POA: Insufficient documentation

## 2015-10-07 DIAGNOSIS — F319 Bipolar disorder, unspecified: Secondary | ICD-10-CM | POA: Insufficient documentation

## 2015-10-07 DIAGNOSIS — F911 Conduct disorder, childhood-onset type: Secondary | ICD-10-CM | POA: Insufficient documentation

## 2015-10-07 DIAGNOSIS — F250 Schizoaffective disorder, bipolar type: Secondary | ICD-10-CM | POA: Diagnosis present

## 2015-10-07 DIAGNOSIS — Z7951 Long term (current) use of inhaled steroids: Secondary | ICD-10-CM | POA: Insufficient documentation

## 2015-10-07 DIAGNOSIS — F909 Attention-deficit hyperactivity disorder, unspecified type: Secondary | ICD-10-CM | POA: Insufficient documentation

## 2015-10-07 DIAGNOSIS — F639 Impulse disorder, unspecified: Secondary | ICD-10-CM | POA: Diagnosis present

## 2015-10-07 DIAGNOSIS — R4689 Other symptoms and signs involving appearance and behavior: Secondary | ICD-10-CM

## 2015-10-07 LAB — COMPREHENSIVE METABOLIC PANEL
ALK PHOS: 100 U/L (ref 38–126)
ALT: 20 U/L (ref 17–63)
AST: 25 U/L (ref 15–41)
Albumin: 4.9 g/dL (ref 3.5–5.0)
Anion gap: 9 (ref 5–15)
BUN: 12 mg/dL (ref 6–20)
CHLORIDE: 96 mmol/L — AB (ref 101–111)
CO2: 29 mmol/L (ref 22–32)
CREATININE: 0.74 mg/dL (ref 0.61–1.24)
Calcium: 9.7 mg/dL (ref 8.9–10.3)
GFR calc Af Amer: >60 mL/min (ref >60)
Glucose, Bld: 133 mg/dL — ABNORMAL HIGH (ref 65–99)
Potassium: 4.1 mmol/L (ref 3.5–5.1)
SODIUM: 134 mmol/L — AB (ref 135–145)
Total Bilirubin: 0.3 mg/dL (ref 0.3–1.2)
Total Protein: 8.2 g/dL — ABNORMAL HIGH (ref 6.5–8.1)

## 2015-10-07 LAB — RAPID URINE DRUG SCREEN, HOSP PERFORMED
AMPHETAMINES: NOT DETECTED
Barbiturates: NOT DETECTED
Benzodiazepines: NOT DETECTED
Cocaine: NOT DETECTED
OPIATES: NOT DETECTED
Tetrahydrocannabinol: NOT DETECTED

## 2015-10-07 LAB — ACETAMINOPHEN LEVEL: Acetaminophen (Tylenol), Serum: <10 ug/mL — ABNORMAL LOW (ref 10–30)

## 2015-10-07 LAB — CBC
HCT: 43 % (ref 39.0–52.0)
Hemoglobin: 14.4 g/dL (ref 13.0–17.0)
MCH: 28.7 pg (ref 26.0–34.0)
MCHC: 33.5 g/dL (ref 30.0–36.0)
MCV: 85.7 fL (ref 78.0–100.0)
PLATELETS: 203 10*3/uL (ref 150–400)
RBC: 5.02 MIL/uL (ref 4.22–5.81)
RDW: 13.2 % (ref 11.5–15.5)
WBC: 7.4 10*3/uL (ref 4.0–10.5)

## 2015-10-07 LAB — ETHANOL: Alcohol, Ethyl (B): <5 mg/dL (ref <5)

## 2015-10-07 LAB — VALPROIC ACID LEVEL: VALPROIC ACID LVL: 113 ug/mL — AB (ref 50.0–100.0)

## 2015-10-07 LAB — SALICYLATE LEVEL: Salicylate Lvl: <4 mg/dL (ref 2.8–30.0)

## 2015-10-07 MED ORDER — CLONIDINE HCL 0.1 MG PO TABS
0.1000 mg | ORAL_TABLET | Freq: Every day | ORAL | Status: DC
  Administered 2015-10-07 – 2015-10-08 (×2): 0.1 mg via ORAL
  Filled 2015-10-07 (×2): qty 1

## 2015-10-07 MED ORDER — OXCARBAZEPINE 300 MG PO TABS: 600.0000 mg | ORAL_TABLET | Freq: Every day | ORAL | Status: DC

## 2015-10-07 MED ORDER — BENZTROPINE MESYLATE 1 MG PO TABS
1.0000 mg | ORAL_TABLET | Freq: Three times a day (TID) | ORAL | Status: DC
  Administered 2015-10-07 – 2015-10-09 (×6): 1 mg via ORAL
  Filled 2015-10-07 (×6): qty 1

## 2015-10-07 MED ORDER — ONDANSETRON HCL 4 MG PO TABS
4.0000 mg | ORAL_TABLET | Freq: Three times a day (TID) | ORAL | Status: DC | PRN
Start: 2015-10-07 — End: 2015-10-09

## 2015-10-07 MED ORDER — DIVALPROEX SODIUM ER 500 MG PO TB24: 500.0000 mg | ORAL_TABLET | Freq: Every day | ORAL | Status: DC

## 2015-10-07 MED ORDER — LORAZEPAM 1 MG PO TABS
1.0000 mg | ORAL_TABLET | Freq: Three times a day (TID) | ORAL | Status: DC | PRN
  Administered 2015-10-07 – 2015-10-08 (×2): 1 mg via ORAL
  Filled 2015-10-07 (×2): qty 1

## 2015-10-07 MED ORDER — DIVALPROEX SODIUM ER 500 MG PO TB24
500.0000 mg | ORAL_TABLET | ORAL | Status: DC
  Administered 2015-10-08 – 2015-10-09 (×2): 500 mg via ORAL
  Filled 2015-10-07 (×3): qty 1

## 2015-10-07 MED ORDER — DIVALPROEX SODIUM ER 500 MG PO TB24
1000.0000 mg | ORAL_TABLET | Freq: Every day | ORAL | Status: DC
  Administered 2015-10-07 – 2015-10-08 (×2): 1000 mg via ORAL
  Filled 2015-10-07 (×4): qty 2

## 2015-10-07 MED ORDER — DIVALPROEX SODIUM ER 500 MG PO TB24
500.0000 mg | ORAL_TABLET | Freq: Every day | ORAL | Status: DC
  Administered 2015-10-08 – 2015-10-09 (×2): 500 mg via ORAL
  Filled 2015-10-07 (×3): qty 1

## 2015-10-07 MED ORDER — LOXAPINE SUCCINATE 25 MG PO CAPS
25.0000 mg | ORAL_CAPSULE | Freq: Three times a day (TID) | ORAL | Status: DC
  Administered 2015-10-07 – 2015-10-09 (×6): 25 mg via ORAL
  Filled 2015-10-07 (×10): qty 1

## 2015-10-07 MED ORDER — ZOLPIDEM TARTRATE 5 MG PO TABS: 5.0000 mg | ORAL_TABLET | Freq: Every evening | ORAL | Status: DC | PRN

## 2015-10-07 MED ORDER — OXCARBAZEPINE 300 MG PO TABS
600.0000 mg | ORAL_TABLET | Freq: Every day | ORAL | Status: DC
  Administered 2015-10-08 – 2015-10-09 (×2): 600 mg via ORAL
  Filled 2015-10-07 (×3): qty 2

## 2015-10-07 MED ORDER — AMLODIPINE BESYLATE 10 MG PO TABS
10.0000 mg | ORAL_TABLET | Freq: Every day | ORAL | Status: DC
  Administered 2015-10-08 – 2015-10-09 (×2): 10 mg via ORAL
  Filled 2015-10-07 (×2): qty 1

## 2015-10-07 MED ORDER — IBUPROFEN 200 MG PO TABS: 600.0000 mg | ORAL_TABLET | Freq: Three times a day (TID) | ORAL | Status: DC | PRN

## 2015-10-07 MED ORDER — OXCARBAZEPINE 300 MG PO TABS
600.0000 mg | ORAL_TABLET | ORAL | Status: DC
  Administered 2015-10-08 – 2015-10-09 (×3): 600 mg via ORAL
  Filled 2015-10-07 (×3): qty 2

## 2015-10-07 MED ORDER — ALUM & MAG HYDROXIDE-SIMETH 200-200-20 MG/5ML PO SUSP: 30.0000 mL | ORAL | Status: DC | PRN

## 2015-10-07 MED ORDER — OXCARBAZEPINE 300 MG PO TABS: 300.0000 mg | ORAL_TABLET | Freq: Two times a day (BID) | ORAL | Status: DC

## 2015-10-07 MED ORDER — ACETAMINOPHEN 325 MG PO TABS: 650.0000 mg | ORAL_TABLET | ORAL | Status: DC | PRN

## 2015-10-07 MED ORDER — OXCARBAZEPINE 300 MG PO TABS
1200.0000 mg | ORAL_TABLET | Freq: Every day | ORAL | Status: DC
  Administered 2015-10-07 – 2015-10-08 (×2): 1200 mg via ORAL
  Filled 2015-10-07 (×4): qty 4
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
## 2015-10-07 NOTE — ED Notes (Addendum)
Pt belongings: beige boots, jeans, long sleeve tshirt, socks
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
## 2015-10-08 DIAGNOSIS — R4689 Other symptoms and signs involving appearance and behavior: Secondary | ICD-10-CM | POA: Insufficient documentation

## 2015-10-08 DIAGNOSIS — F6089 Other specific personality disorders: Secondary | ICD-10-CM

## 2015-10-08 NOTE — ED Notes (Signed)
Pt was transferred back from the main ER.

## 2015-10-08 NOTE — Progress Notes (Signed)
[facility] PCP Call This is the medicaid doctor listed on your medicaid card. This office is not accepting new medicaid patients  PLEASE CALL DSS [phone] to have this Dr removed from your card ASAP and to enter in a new medicaid accepting Dr [facility] [address] [place] [id number] [phone]  [facility] [place] [facility]  [address] [place]  [phone]

## 2015-10-08 NOTE — ED Notes (Signed)
Pt up walking in hall repeatedly asking about being discharged. He states "where's my mom? She doesn't love me anymore". Pt was asked to return to his room and try to get rest until breakfast arrives.

## 2015-10-08 NOTE — ED Notes (Signed)
Pt ambulated to restroom with sitter. 

## 2015-10-08 NOTE — ED Notes (Signed)
Pt sleeping; will do psych assessment when he awakens

## 2015-10-08 NOTE — ED Notes (Signed)
Bed: [id number] Expected date:  Expected time:  Means of arrival:  Comments: RM 23

## 2015-10-08 NOTE — ED Notes (Signed)
Delay on vitals pt is asleep

## 2015-10-08 NOTE — ED Notes (Signed)
Pt ambulated to restroom with steady gait.

## 2015-10-08 NOTE — Consult Note (Signed)
[facility] Consult   Reason for Consult: Aggression Referring Physician: EDP Patient Identification: Willie Boyer MRN:  [id number] Principal Diagnosis: <principal problem not specified> Diagnosis:   Patient Active Problem List   Diagnosis Date Noted  . ADHD (attention deficit hyperactivity disorder) [F90.9] 05/21/2015  . Bipolar 1 disorder ([place]) [F31.9] 05/21/2015    Total Time spent with patient: 30 minutes  Subjective:   Willie Boyer is a 34 y.o. male patient admitted with increased aggressive outbursts for the past several weeks sometimes in public but often directed towards his mother.   HPI:   Willie Boyer is an 34 y.o. male who was brought to [facility] by Father and Mother. The patient has been a poor historian but reported getting into fights with his mother. His mother reported that she has been afraid of the patient hurting her during her sleep and that he has thrown objects when upset. She reported that the patient has a history of ADD and Autism. His mother reported that he has been seeing diagnoses of ADD and Autism. The mother felt that his current medication regimen is not working to manage his mood lability. So far the patient has not been agitated per nursing notes. Patient has been asking questions about when he can go home. His mother was asked to provide documentation regarding his IQ score.   Past Psychiatric History: As above   Risk to Self: Suicidal Ideation: No Suicidal Intent: No Is patient at risk for suicide?: No Suicidal Plan?: No Access to Means: No What has been your use of drugs/alcohol within the last 12 months?: None How many times?: 0 Other Self Harm Risks: None Triggers for Past Attempts: None known Intentional Self Injurious Behavior: None Risk to Others: Homicidal Ideation: No Thoughts of Harm to Others: No-Not Currently Present/Within Last 6 Months Current Homicidal Intent: No Current Homicidal Plan: No Access to Homicidal  Means: No Identified Victim: Mother History of harm to others?: No Assessment of Violence: In past 6-12 months (Mother reports Patient has thrown things) Violent Behavior Description: Patient gets upset with Mother and throws objects Does patient have access to weapons?: No Criminal Charges Pending?: No Does patient have a court date: No Prior Inpatient Therapy: Prior Inpatient Therapy: No Prior Therapy Dates: N/A Prior Therapy Facilty/Provider(s): N/A Reason for Treatment: N/A Prior Outpatient Therapy: Prior Outpatient Therapy: Yes Prior Therapy Dates: Current Prior Therapy Facilty/Provider(s): Dr. Reece Levy (med management) Reason for Treatment: ADHD and Austism Does patient have an ACCT team?: No Does patient have Intensive In-House Services?  : No Does patient have [facility] services? : No Does patient have P4CC services?: No  Past Medical History:  Past Medical History  Diagnosis Date  . ADD (attention deficit disorder)   . Bipolar 1 disorder ([place])   . Psychosis   . Autism     Past Surgical History  Procedure Laterality Date  . Leg surgery     Family History: No family history on file. Social History:  History  Alcohol Use No     History  Drug Use No    Social History   Social History  . Marital Status: Single    Spouse Name: N/A  . Number of Children: N/A  . Years of Education: N/A   Social History Main Topics  . Smoking status: Never Smoker   . Smokeless tobacco: None  . Alcohol Use: No  . Drug Use: No  . Sexual Activity: Not Asked   Other Topics Concern  . None  Social History Narrative   Additional Social History:                          Allergies:   Allergies  Allergen Reactions  . Apple Diarrhea and Nausea And Vomiting    "pancakes, apple juice"  . Latex Other (See Comments)    Burns, welps.    Labs:  Results for orders placed or performed during the hospital encounter of 10/07/15 (from the past 48 hour(s))  Urine rapid drug  screen (hosp performed) (Not at [facility])     Status: None   Collection Time: 10/07/15  2:59 PM  Result Value Ref Range   Opiates NONE DETECTED NONE DETECTED   Cocaine NONE DETECTED NONE DETECTED   Benzodiazepines NONE DETECTED NONE DETECTED   Amphetamines NONE DETECTED NONE DETECTED   Tetrahydrocannabinol NONE DETECTED NONE DETECTED   Barbiturates NONE DETECTED NONE DETECTED    Comment:        DRUG SCREEN FOR MEDICAL PURPOSES ONLY.  IF CONFIRMATION IS NEEDED FOR ANY PURPOSE, NOTIFY LAB WITHIN 5 DAYS.        LOWEST DETECTABLE LIMITS FOR URINE DRUG SCREEN Drug Class       Cutoff (ng/mL) Amphetamine      1000 Barbiturate      200 Benzodiazepine   158 Tricyclics       309 Opiates          300 Cocaine          300 THC              50   Comprehensive metabolic panel     Status: Abnormal   Collection Time: 10/07/15  3:30 PM  Result Value Ref Range   Sodium 134 (L) 135 - 145 mmol/L   Potassium 4.1 3.5 - 5.1 mmol/L   Chloride 96 (L) 101 - 111 mmol/L   CO2 29 22 - 32 mmol/L   Glucose, Bld 133 (H) 65 - 99 mg/dL   BUN 12 6 - 20 mg/dL   Creatinine, Ser 0.74 0.61 - 1.24 mg/dL   Calcium 9.7 8.9 - 10.3 mg/dL   Total Protein 8.2 (H) 6.5 - 8.1 g/dL   Albumin 4.9 3.5 - 5.0 g/dL   AST 25 15 - 41 U/L   ALT 20 17 - 63 U/L   Alkaline Phosphatase 100 38 - 126 U/L   Total Bilirubin 0.3 0.3 - 1.2 mg/dL   GFR calc non Af Amer >60 >60 mL/min   GFR calc Af Amer >60 >60 mL/min    Comment: (NOTE) The eGFR has been calculated using the CKD EPI equation. This calculation has not been validated in all clinical situations. eGFR's persistently <60 mL/min signify possible Chronic Kidney Disease.    Anion gap 9 5 - 15  Ethanol (ETOH)     Status: None   Collection Time: 10/07/15  3:30 PM  Result Value Ref Range   Alcohol, Ethyl (B) <5 <5 mg/dL    Comment:        LOWEST DETECTABLE LIMIT FOR SERUM ALCOHOL IS 5 mg/dL FOR MEDICAL PURPOSES ONLY   Salicylate level     Status: None   Collection Time:  10/07/15  3:30 PM  Result Value Ref Range   Salicylate Lvl <4.0 2.8 - 30.0 mg/dL  Acetaminophen level     Status: Abnormal   Collection Time: 10/07/15  3:30 PM  Result Value Ref Range   Acetaminophen (Tylenol), Serum <10 (L) 10 -  30 ug/mL    Comment:        THERAPEUTIC CONCENTRATIONS VARY SIGNIFICANTLY. A RANGE OF 10-30 ug/mL MAY BE AN EFFECTIVE CONCENTRATION FOR MANY PATIENTS. HOWEVER, SOME ARE BEST TREATED AT CONCENTRATIONS OUTSIDE THIS RANGE. ACETAMINOPHEN CONCENTRATIONS >150 ug/mL AT 4 HOURS AFTER INGESTION AND >50 ug/mL AT 12 HOURS AFTER INGESTION ARE OFTEN ASSOCIATED WITH TOXIC REACTIONS.   CBC     Status: None   Collection Time: 10/07/15  3:30 PM  Result Value Ref Range   WBC 7.4 4.0 - 10.5 K/uL   RBC 5.02 4.22 - 5.81 MIL/uL   Hemoglobin 14.4 13.0 - 17.0 g/dL   HCT 43.0 39.0 - 52.0 %   MCV 85.7 78.0 - 100.0 fL   MCH 28.7 26.0 - 34.0 pg   MCHC 33.5 30.0 - 36.0 g/dL   RDW 13.2 11.5 - 15.5 %   Platelets 203 150 - 400 K/uL  Valproic acid level     Status: Abnormal   Collection Time: 10/07/15  3:30 PM  Result Value Ref Range   Valproic Acid Lvl 113 (H) 50.0 - 100.0 ug/mL    Current Facility-Administered Medications  Medication Dose Route Frequency Provider Last Rate Last Dose  . acetaminophen (TYLENOL) tablet 650 mg  650 mg Oral Q4H PRN Larene Pickett, PA-C      . alum & mag hydroxide-simeth (MAALOX/MYLANTA) 200-200-20 MG/5ML suspension 30 mL  30 mL Oral PRN Larene Pickett, PA-C      . amLODipine (NORVASC) tablet 10 mg  10 mg Oral Daily Larene Pickett, PA-C   10 mg at 10/08/15 0941  . benztropine (COGENTIN) tablet 1 mg  1 mg Oral TID Larene Pickett, PA-C   1 mg at 10/08/15 1759  . cloNIDine (CATAPRES) tablet 0.1 mg  0.1 mg Oral QHS Larene Pickett, PA-C   0.1 mg at 10/07/15 2151  . divalproex (DEPAKOTE ER) 24 hr tablet 1,000 mg  1,000 mg Oral QHS Lacretia Leigh, MD   1,000 mg at 10/07/15 2150  . divalproex (DEPAKOTE ER) 24 hr tablet 500 mg  500 mg Oral Q breakfast  Lacretia Leigh, MD   500 mg at 10/08/15 0756  . divalproex (DEPAKOTE ER) 24 hr tablet 500 mg  500 mg Oral Q24H Lacretia Leigh, MD   500 mg at 10/08/15 1759  . ibuprofen (ADVIL,MOTRIN) tablet 600 mg  600 mg Oral Q8H PRN Larene Pickett, PA-C      . loxapine Gust Rung) capsule 25 mg  25 mg Oral TID Larene Pickett, PA-C   25 mg at 10/08/15 1800  . ondansetron (ZOFRAN) tablet 4 mg  4 mg Oral Q8H PRN Larene Pickett, PA-C      . Oxcarbazepine (TRILEPTAL) tablet 1,200 mg  1,200 mg Oral QHS Lacretia Leigh, MD   1,200 mg at 10/07/15 2152  . Oxcarbazepine (TRILEPTAL) tablet 600 mg  600 mg Oral Q breakfast Lacretia Leigh, MD   600 mg at 10/08/15 0756  . Oxcarbazepine (TRILEPTAL) tablet 600 mg  600 mg Oral Q24H Lacretia Leigh, MD   600 mg at 10/08/15 1800  . zolpidem (AMBIEN) tablet 5 mg  5 mg Oral QHS PRN Larene Pickett, PA-C       Current Outpatient Prescriptions  Medication Sig Dispense Refill  . amLODipine (NORVASC) 10 MG tablet Take 1 tablet (10 mg total) by mouth daily. 30 tablet 0  . benztropine (COGENTIN) 1 MG tablet Take 1 mg by mouth 3 (three) times daily.  0  .  cloNIDine (CATAPRES) 0.1 MG tablet Take 0.1 mg by mouth at bedtime.    . divalproex (DEPAKOTE ER) 500 MG 24 hr tablet Take 500-1,000 mg by mouth See admin instructions. Take 500 mg in the morning, 522m at 4pm, and 10029mat bedtime    . fluticasone (FLONASE) 50 MCG/ACT nasal spray Place 1 spray into both nostrils daily.    . Marland Kitchenoxapine (LOXITANE) 25 MG capsule Take 25 mg by mouth 3 (three) times daily.  0  . oxcarbazepine (TRILEPTAL) 600 MG tablet Take 600-1,200 mg by mouth. Take 60062mt 8am, 600m44m 4pm, and 1200mg39mbedtime    . Phenyleph-CPM-DM-APAP (ALKA-SELTZER PLUS COLD & FLU PO) Take 1 Dose by mouth daily as needed (cold symptoms).    . HYDMarland KitchenOcodone-acetaminophen (NORCO/VICODIN) 5-325 MG per tablet Take 1 tablet by mouth every 6 (six) hours as needed for moderate pain. (Patient not taking: Reported on 09/21/2015) 15 tablet 0  . nicotine  (NICODERM CQ - DOSED IN MG/24 HOURS) 21 mg/24hr patch Place 1 patch (21 mg total) onto the skin daily. (Patient not taking: Reported on 09/21/2015) 28 patch 0  . Oxcarbazepine (TRILEPTAL) 300 MG tablet Take 1 tablet (300 mg total) by mouth 2 (two) times daily. (Patient not taking: Reported on 10/07/2015) 60 tablet 0  . QUEtiapine (SEROQUEL) 100 MG tablet Take 1 tablet (100 mg total) by mouth daily. (Patient not taking: Reported on 09/21/2015) 30 tablet 0  . QUEtiapine (SEROQUEL) 300 MG tablet Take 2 tablets (600 mg total) by mouth at bedtime. (Patient not taking: Reported on 09/21/2015) 60 tablet 0    Musculoskeletal: Strength & Muscle Tone: within normal limits Gait & Station: normal Patient leans: N/A  Psychiatric Specialty Exam: Review of Systems  Psychiatric/Behavioral: Positive for depression. The patient is nervous/anxious.     Blood pressure 139/85, pulse 86, temperature 98.2 F (36.8 C), temperature source Oral, resp. rate 17, SpO2 100 %.There is no height or weight on file to calculate BMI.  General Appearance: Casual  Eye Contact::  Fair  Speech:  Clear and Coherent  Volume:  Normal  Mood:  Anxious  Affect:  Blunt  Thought Process:  Coherent  Orientation:  Full (Time, Place, and Person)  Thought Content:  Rumination  Suicidal Thoughts:  No  Homicidal Thoughts:  No  Memory:  Immediate;   Fair Recent;   Fair Remote;   Fair  Judgement:  Fair  Insight:  Shallow  Psychomotor Activity:  Normal  Concentration:  Good  Recall:  Fair Marmadukeguage: Fair  Akathisia:  No  Handed:  Right  AIMS (if indicated):     Assets:  Desire for Improvement Housing Intimacy Leisure Time Physical Health  ADL's:  Intact  Cognition: Impaired,  Mild  Sleep:      Treatment Plan Summary: Daily contact with patient to assess and evaluate symptoms and progress in treatment and Medication management  Disposition: Reassess in the morning for possible discharge back to  parents after medication adjustment that was done today by Dr. AkintAntionette FairyC 10/08/2015 6:32 PM Patient seen face-to-face for psychiatric evaluation, chart reviewed and case discussed with the physician extender and developed treatment plan. Reviewed the information documented and agree with the treatment plan. MojeeCorena Pilgrim

## 2015-10-09 DIAGNOSIS — F250 Schizoaffective disorder, bipolar type: Secondary | ICD-10-CM

## 2015-10-09 DIAGNOSIS — F639 Impulse disorder, unspecified: Secondary | ICD-10-CM | POA: Diagnosis present

## 2015-10-09 DIAGNOSIS — F840 Autistic disorder: Secondary | ICD-10-CM

## 2015-10-09 NOTE — [facility] Suicide Risk Assessment (Cosign Needed)
Suicide Risk Assessment  Discharge Assessment   [facility]   Demographic Factors:  Male, Adolescent or young adult, Low socioeconomic status and Unemployed  Total Time spent with patient: 20 minutes  Musculoskeletal: Strength & Muscle Tone: within normal limits Gait & Station: normal Patient leans: N/A  Psychiatric Specialty Exam:     Blood pressure 124/66, pulse 79, temperature 98 F (36.7 C), temperature source Oral, resp. rate 18, SpO2 95 %.There is no height or weight on file to calculate BMI.  General Appearance: Casual  Eye Contact:: Fair  Speech: Clear and Coherent  Volume: Normal  Mood: Calm, euthymic  Affect: bright  Thought Process: Coherent  Orientation: Full (Time, Place, and Person)  Thought Content: Rumination  Suicidal Thoughts: No  Homicidal Thoughts: No  Memory: Immediate; Fair Recent; Fair Remote; Fair  Judgement: Fair  Insight: Shallow  Psychomotor Activity: Normal  Concentration: Good  Recall: FiservFair  Fund of Knowledge:Fair  Language: Fair  Akathisia: No  Handed: Right  AIMS (if indicated):    Assets: Desire for Improvement Housing Intimacy Leisure Time Physical Health  ADL's: Intact  Cognition: Impaired, Mild            Has this patient used any form of tobacco in the last 30 days? (Cigarettes, Smokeless Tobacco, Cigars, and/or Pipes) N/A  Mental Status Per Nursing Assessment::   On Admission:     Current Mental Status by Physician: NA  Loss Factors: NA  Historical Factors: NA  Risk Reduction Factors:   Living with another person, especially a relative and Positive social support  Continued Clinical Symptoms:  Severe Anxiety and/or Agitation Previous Psychiatric Diagnoses and Treatments  Cognitive Features That Contribute To Risk:  Polarized thinking    Suicide Risk:  Minimal: No identifiable suicidal ideation.  Patients presenting with no  risk factors but with morbid ruminations; may be classified as minimal risk based on the severity of the depressive symptoms  Principal Problem: Autistic disorder Discharge Diagnoses:  Patient Active Problem List   Diagnosis Date Noted  . Autistic disorder [F84.0] 10/09/2015    Priority: Medium  . Schizoaffective disorder, bipolar type ([facility]) [F25.0] 10/09/2015  . Impulse control disorder [F63.9] 10/09/2015  . Aggressive behavior [F60.89]   . ADHD (attention deficit hyperactivity disorder) [F90.9] 05/21/2015  . Bipolar 1 disorder ([facility]) [F31.9] 05/21/2015    Follow-up Information    [facility]   Why:  This is the medicaid doctor listed on your medicaid card.  This office is not accepting new medicaid patients PLEASE CALL DSS [phone] to have this Dr removed from your card ASAP and to enter in a new medicaid accepting Dr   Benay Pillowontact information:   [facility] [address] [place], [place] [id number] [phone]  [facility]  [address] [place]  [phone]      Plan Of Care/Follow-up recommendations:  Activity:  as tolerated Diet:  regular   Is patient on multiple antipsychotic therapies at discharge:  No   Has Patient had three or more failed trials of antipsychotic monotherapy by history:  No  Recommended Plan for Multiple Antipsychotic Therapies: NA    Dahlia ByesONUOHA, Marycruz Boehner C   PMHNP-BC 10/09/2015, 4:32 PM

## 2015-10-09 NOTE — ED Notes (Addendum)
Pt remains a 1;1 and remains calm . Pt is for discharge today. He denies Si and Hi and contracts for safety. Per , Tom pts mom will pick the pt up between 5:30p and 6pm. Pt remains in his room and is cooperative. Pt did have a shower today and needed minimal assistance per the tech.

## 2015-10-09 NOTE — Consult Note (Signed)
Psychiatric Specialty Exam: Physical Exam  ROS  Blood pressure 124/66, pulse 79, temperature 98 F (36.7 C), temperature source Oral, resp. rate 18, SpO2 95 %.There is no height or weight on file to calculate BMI.  General Appearance: Casual  Eye Contact:: Fair  Speech: Clear and Coherent  Volume: Normal  Mood: calm,euthymic  Affect: Bright  Thought Process: Coherent  Orientation: Full (Time, Place, and Person)  Thought Content: Rumination  Suicidal Thoughts: No  Homicidal Thoughts: No  Memory: Immediate; Fair Recent; Fair Remote; Fair  Judgement: Fair  Insight: Shallow  Psychomotor Activity: Normal  Concentration: Good  Recall: Fair  Fund of Knowledge:Fair  Language: Fair  Akathisia: No  Handed: Right  AIMS (if indicated):    Assets: Desire for Improvement Housing Intimacy Leisure Time Physical Health  ADL's: Intact  Cognition: Impaired, Mild        Sleep:      Patient was seen in his room this morning watching TV.  He has been calm and cooperative and has not had any behavior issues.  He is eating and drinking well.  He denies SI/HI/AVH this morning.  He participated in the interview by smiling.  Patient is discharged now to follow up with Dr Betti Cruzeddy his Psychiatrist.  Autistic disorder   Plan:  Discharge home, will follow up with Dr Betti Cruzeddy.  Dahlia ByesJosephine Onuoha   PMHNP-BC Patient seen face-to-face for psychiatric evaluation, chart reviewed and case discussed with the physician extender and developed treatment plan. Reviewed the information documented and agree with the treatment plan. Thedore MinsMojeed Julizza Sassone, MD

## 2015-10-09 NOTE — BH Assessment (Signed)
[facility] Assessment Progress Note  Per Thedore MinsMojeed Akintayo, MD, this pt does not require psychiatric hospitalization at this time.  [facility] to continue treatment with Dr Betti Cruzeddy, his outpatient psychiatrist.  Dr Jannifer FranklinAkintayo requests that this writer call pt's mother, Nancy FetterRobin Derstine, to coordinate discharge.  At 14:30 I called Ms Adrian BlackwaterCrute at [phone].  She reports that she is pt's power of attorney.  She confirms that pt sees Dr Betti Cruzeddy, adding that pt has an appointment scheduled for Wednesday, 10/31/2015, at 10:20.  This will be included in pt's discharge instructions.  She reports that she will be able to come to the ED to pick up the pt today between 17:00 and 18:00.  Dahlia ByesJosephine Onuoha, NP and pt's nurse, Marylu LundJanet, have been notified.  Doylene Canninghomas Lareen Mullings, MA Triage Specialist [phone]

## 2015-10-09 NOTE — Discharge Instructions (Signed)
For your ongoing behavioral health treatment needs, you are advised to continue treatment with Ellamae SiaKeshavpal Reddy, MD, your outpatient psychiatrist.  Your next scheduled appointment is on Wednesday, 10/31/2015 at 10:20 am.       Ellamae SiaKeshavpal Reddy, MD      [facility]      [address]      [place], [place] [address]      [phone]

## 2016-06-18 IMAGING — CR DG ABDOMEN ACUTE W/ 1V CHEST
4 series · 4 of 4 positions shown · non-contrast
Comparison: None.

CLINICAL DATA: Vomiting for 2 weeks.

EXAM:
DG ABDOMEN ACUTE W/ 1V CHEST

[w chest pa]
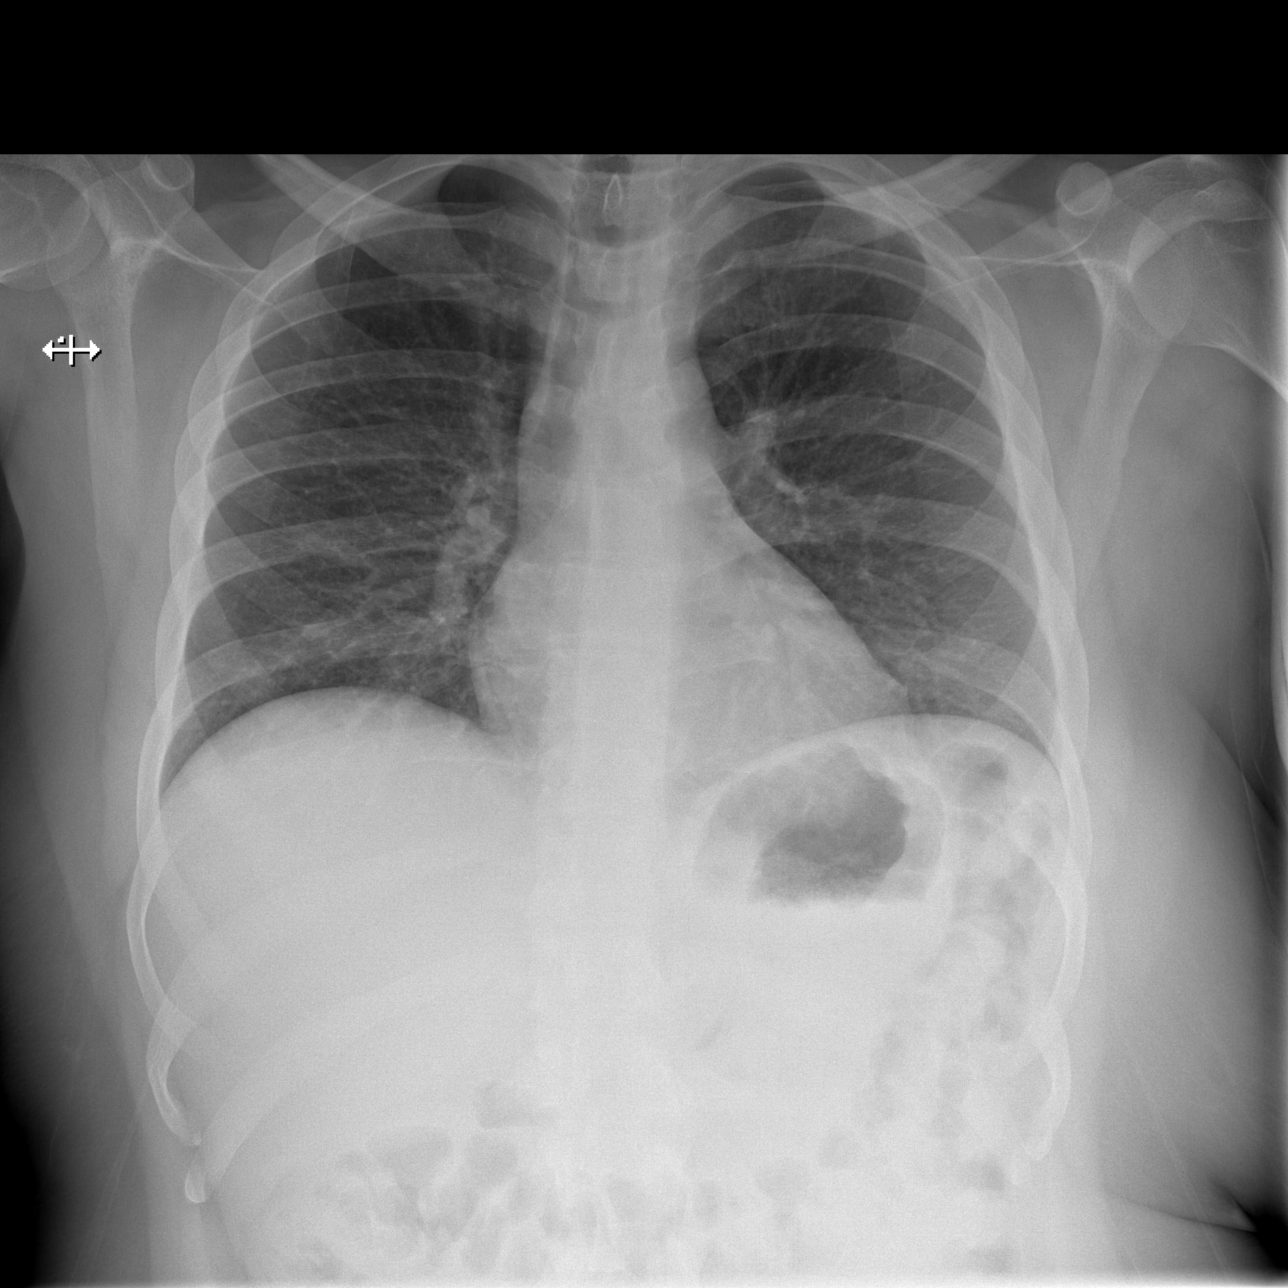

[w abdomen upright]
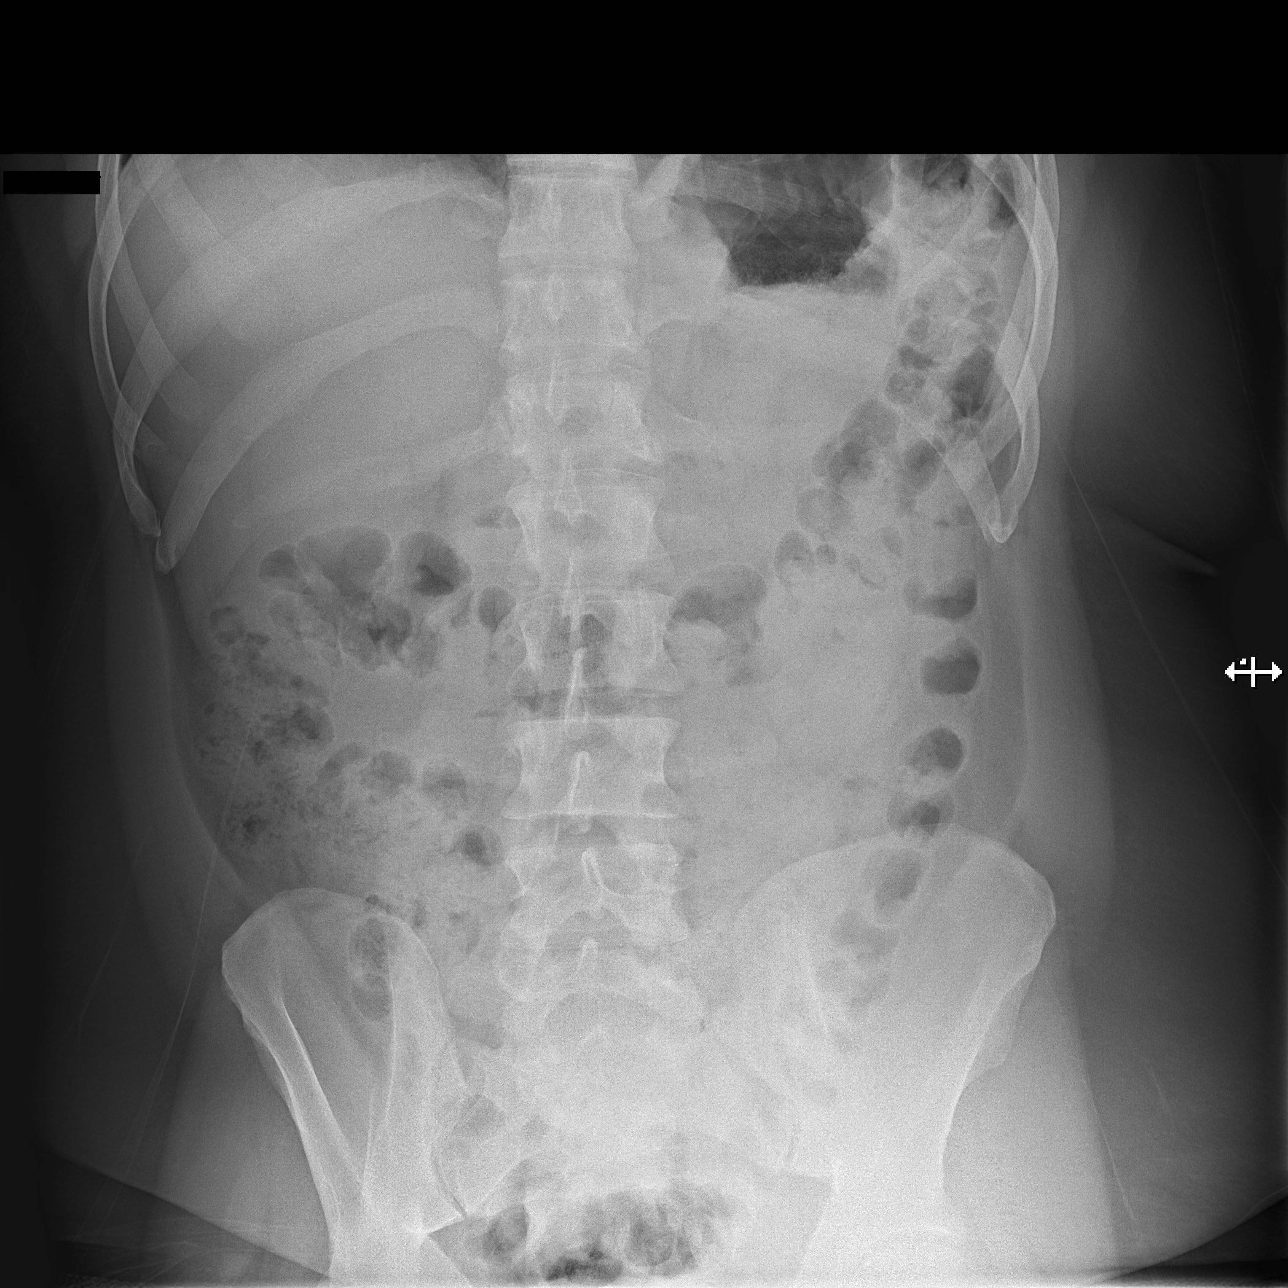

[t abdomen supine (1 of 2)]
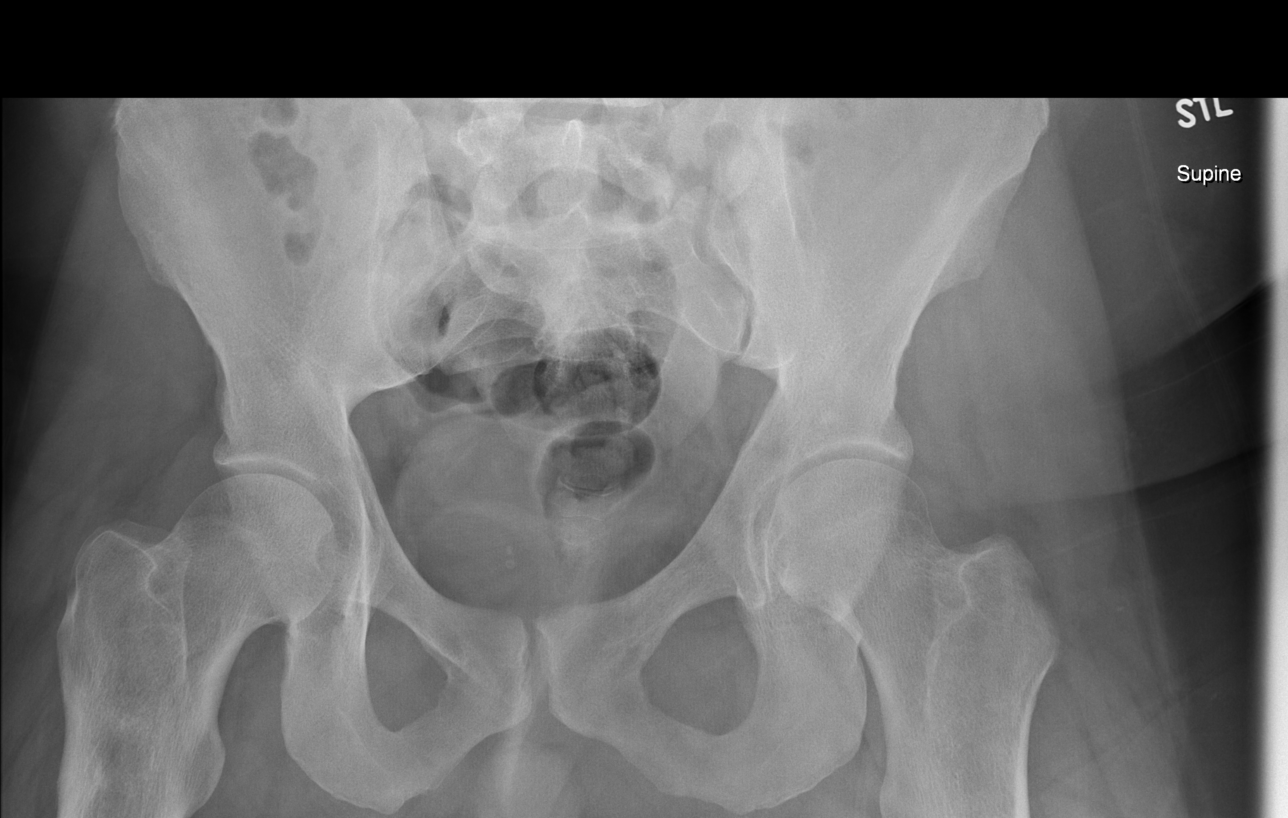

[t abdomen supine (2 of 2)]
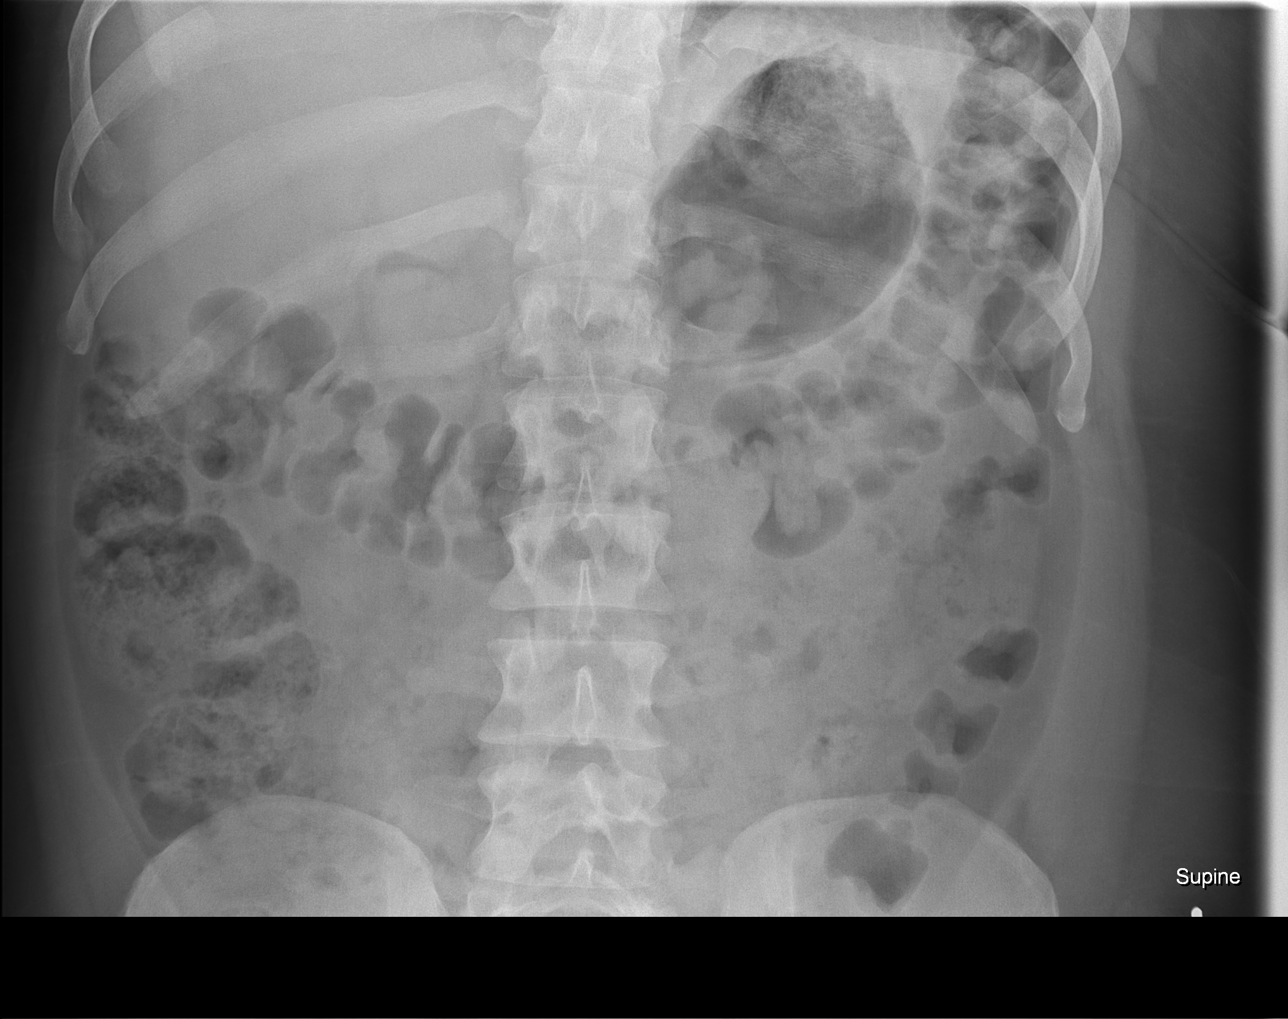

[4 of 4 positions shown; findings below may reference images not displayed]

FINDINGS: There is no evidence of dilated bowel loops or free intraperitoneal
air. No radiopaque calculi or other significant radiographic
abnormality is seen. Heart size and mediastinal contours are within
normal limits. Both lungs are clear.
IMPRESSION: Negative abdominal radiographs.  No acute cardiopulmonary disease.
